# Patient Record
Sex: Female | Born: 1985 | Race: White | Hispanic: No | Marital: Single | Smoking: Current every day smoker
Health system: Southern US, Community
[De-identification: ages and names within clinical notes are randomized; demographics above are authoritative.]

## PROBLEM LIST (undated history)

## (undated) ENCOUNTER — Inpatient Hospital Stay (HOSPITAL_COMMUNITY): Payer: Self-pay

## (undated) DIAGNOSIS — N946 Dysmenorrhea, unspecified: Secondary | ICD-10-CM

## (undated) DIAGNOSIS — N949 Unspecified condition associated with female genital organs and menstrual cycle: Secondary | ICD-10-CM

## (undated) DIAGNOSIS — J309 Allergic rhinitis, unspecified: Secondary | ICD-10-CM

## (undated) DIAGNOSIS — F17200 Nicotine dependence, unspecified, uncomplicated: Secondary | ICD-10-CM

## (undated) DIAGNOSIS — H1045 Other chronic allergic conjunctivitis: Secondary | ICD-10-CM

## (undated) HISTORY — DX: Nicotine dependence, unspecified, uncomplicated: F17.200

## (undated) HISTORY — DX: Dysmenorrhea, unspecified: N94.6

## (undated) HISTORY — PX: COLPOSCOPY: SHX161

## (undated) HISTORY — DX: Allergic rhinitis, unspecified: J30.9

## (undated) HISTORY — DX: Other chronic allergic conjunctivitis: H10.45

## (undated) HISTORY — DX: Unspecified condition associated with female genital organs and menstrual cycle: N94.9

## (undated) HISTORY — PX: APPENDECTOMY: SHX54

---
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
## 2004-01-08 ENCOUNTER — Other Ambulatory Visit: Admission: RE | Admit: 2004-01-08 | Discharge: 2004-01-08 | Payer: Self-pay | Admitting: Family Medicine

## 2004-01-08 ENCOUNTER — Ambulatory Visit: Payer: Self-pay | Admitting: Family Medicine

## 2004-02-10 ENCOUNTER — Ambulatory Visit: Payer: Self-pay | Admitting: Family Medicine

## 2004-02-17 ENCOUNTER — Ambulatory Visit (HOSPITAL_COMMUNITY): Admission: RE | Admit: 2004-02-17 | Discharge: 2004-02-17 | Payer: Self-pay | Admitting: Family Medicine

## 2004-02-27 ENCOUNTER — Emergency Department (HOSPITAL_COMMUNITY): Admission: EM | Admit: 2004-02-27 | Discharge: 2004-02-27 | Payer: Self-pay | Admitting: Emergency Medicine

## 2004-03-11 ENCOUNTER — Ambulatory Visit: Payer: Self-pay | Admitting: Sports Medicine

## 2004-03-25 ENCOUNTER — Ambulatory Visit (HOSPITAL_COMMUNITY): Admission: RE | Admit: 2004-03-25 | Discharge: 2004-03-25 | Payer: Self-pay | Admitting: Family Medicine

## 2004-04-11 ENCOUNTER — Ambulatory Visit: Payer: Self-pay | Admitting: Family Medicine

## 2004-04-27 ENCOUNTER — Ambulatory Visit: Payer: Self-pay | Admitting: Sports Medicine

## 2004-04-30 ENCOUNTER — Emergency Department (HOSPITAL_COMMUNITY): Admission: EM | Admit: 2004-04-30 | Discharge: 2004-04-30 | Payer: Self-pay | Admitting: Emergency Medicine

## 2004-05-13 ENCOUNTER — Ambulatory Visit: Payer: Self-pay | Admitting: Sports Medicine

## 2004-05-25 ENCOUNTER — Ambulatory Visit: Payer: Self-pay | Admitting: Family Medicine

## 2004-06-08 ENCOUNTER — Ambulatory Visit: Payer: Self-pay | Admitting: Family Medicine

## 2004-06-14 ENCOUNTER — Ambulatory Visit: Payer: Self-pay | Admitting: Family Medicine

## 2004-06-22 ENCOUNTER — Ambulatory Visit: Payer: Self-pay | Admitting: Family Medicine

## 2004-06-28 ENCOUNTER — Ambulatory Visit: Payer: Self-pay | Admitting: Sports Medicine

## 2004-07-04 ENCOUNTER — Ambulatory Visit: Payer: Self-pay | Admitting: Sports Medicine

## 2004-07-15 ENCOUNTER — Ambulatory Visit: Payer: Self-pay | Admitting: Family Medicine

## 2004-07-18 ENCOUNTER — Inpatient Hospital Stay (HOSPITAL_COMMUNITY): Admission: AD | Admit: 2004-07-18 | Discharge: 2004-07-20 | Payer: Self-pay | Admitting: Family Medicine

## 2004-07-18 ENCOUNTER — Ambulatory Visit: Payer: Self-pay | Admitting: Obstetrics and Gynecology

## 2004-08-30 ENCOUNTER — Ambulatory Visit: Payer: Self-pay | Admitting: Family Medicine

## 2004-08-30 ENCOUNTER — Encounter (INDEPENDENT_AMBULATORY_CARE_PROVIDER_SITE_OTHER): Payer: Self-pay | Admitting: *Deleted

## 2004-08-30 ENCOUNTER — Other Ambulatory Visit: Admission: RE | Admit: 2004-08-30 | Discharge: 2004-08-30 | Payer: Self-pay | Admitting: Family Medicine

## 2004-09-13 ENCOUNTER — Ambulatory Visit: Payer: Self-pay | Admitting: Family Medicine

## 2004-09-22 ENCOUNTER — Ambulatory Visit: Payer: Self-pay | Admitting: Family Medicine

## 2005-03-01 ENCOUNTER — Encounter (INDEPENDENT_AMBULATORY_CARE_PROVIDER_SITE_OTHER): Payer: Self-pay | Admitting: *Deleted

## 2005-03-01 LAB — CONVERTED CEMR LAB

## 2005-03-09 ENCOUNTER — Ambulatory Visit: Payer: Self-pay | Admitting: Family Medicine

## 2005-03-29 ENCOUNTER — Ambulatory Visit: Payer: Self-pay | Admitting: Sports Medicine

## 2005-03-29 ENCOUNTER — Other Ambulatory Visit: Admission: RE | Admit: 2005-03-29 | Discharge: 2005-03-29 | Payer: Self-pay | Admitting: Family Medicine

## 2005-11-15 ENCOUNTER — Ambulatory Visit: Payer: Self-pay | Admitting: Sports Medicine

## 2005-11-22 ENCOUNTER — Encounter (INDEPENDENT_AMBULATORY_CARE_PROVIDER_SITE_OTHER): Payer: Self-pay | Admitting: Specialist

## 2005-11-22 ENCOUNTER — Ambulatory Visit: Payer: Self-pay | Admitting: Family Medicine

## 2005-11-23 ENCOUNTER — Ambulatory Visit (HOSPITAL_COMMUNITY): Admission: RE | Admit: 2005-11-23 | Discharge: 2005-11-23 | Payer: Self-pay | Admitting: Family Medicine

## 2006-01-02 ENCOUNTER — Ambulatory Visit: Payer: Self-pay | Admitting: Sports Medicine

## 2006-01-30 ENCOUNTER — Ambulatory Visit: Payer: Self-pay | Admitting: Family Medicine

## 2006-02-08 ENCOUNTER — Ambulatory Visit (HOSPITAL_COMMUNITY): Admission: RE | Admit: 2006-02-08 | Discharge: 2006-02-08 | Payer: Self-pay | Admitting: Family Medicine

## 2006-03-02 ENCOUNTER — Ambulatory Visit: Payer: Self-pay | Admitting: Family Medicine

## 2006-04-02 ENCOUNTER — Ambulatory Visit: Payer: Self-pay | Admitting: Sports Medicine

## 2006-05-04 ENCOUNTER — Ambulatory Visit: Payer: Self-pay | Admitting: Family Medicine

## 2006-05-16 ENCOUNTER — Ambulatory Visit: Payer: Self-pay | Admitting: Family Medicine

## 2006-05-31 ENCOUNTER — Ambulatory Visit: Payer: Self-pay | Admitting: Family Medicine

## 2006-06-07 ENCOUNTER — Encounter (INDEPENDENT_AMBULATORY_CARE_PROVIDER_SITE_OTHER): Payer: Self-pay | Admitting: Family Medicine

## 2006-06-07 ENCOUNTER — Ambulatory Visit: Payer: Self-pay | Admitting: Family Medicine

## 2006-06-15 ENCOUNTER — Ambulatory Visit: Payer: Self-pay | Admitting: Family Medicine

## 2006-06-26 ENCOUNTER — Ambulatory Visit: Payer: Self-pay | Admitting: Family Medicine

## 2006-06-28 DIAGNOSIS — J309 Allergic rhinitis, unspecified: Secondary | ICD-10-CM

## 2006-06-28 DIAGNOSIS — N879 Dysplasia of cervix uteri, unspecified: Secondary | ICD-10-CM | POA: Insufficient documentation

## 2006-06-28 DIAGNOSIS — H1045 Other chronic allergic conjunctivitis: Secondary | ICD-10-CM

## 2006-06-28 DIAGNOSIS — N946 Dysmenorrhea, unspecified: Secondary | ICD-10-CM

## 2006-06-28 HISTORY — DX: Dysmenorrhea, unspecified: N94.6

## 2006-06-28 HISTORY — DX: Allergic rhinitis, unspecified: J30.9

## 2006-06-28 HISTORY — DX: Other chronic allergic conjunctivitis: H10.45

## 2006-06-29 ENCOUNTER — Encounter (INDEPENDENT_AMBULATORY_CARE_PROVIDER_SITE_OTHER): Payer: Self-pay | Admitting: *Deleted

## 2006-06-30 ENCOUNTER — Ambulatory Visit: Payer: Self-pay | Admitting: *Deleted

## 2006-06-30 ENCOUNTER — Inpatient Hospital Stay (HOSPITAL_COMMUNITY): Admission: AD | Admit: 2006-06-30 | Discharge: 2006-07-03 | Payer: Self-pay | Admitting: Obstetrics & Gynecology

## 2006-08-10 ENCOUNTER — Encounter (INDEPENDENT_AMBULATORY_CARE_PROVIDER_SITE_OTHER): Payer: Self-pay | Admitting: Family Medicine

## 2007-05-16 ENCOUNTER — Other Ambulatory Visit: Admission: RE | Admit: 2007-05-16 | Discharge: 2007-05-16 | Payer: Self-pay | Admitting: Family Medicine

## 2007-05-17 ENCOUNTER — Encounter (INDEPENDENT_AMBULATORY_CARE_PROVIDER_SITE_OTHER): Payer: Self-pay | Admitting: Family Medicine

## 2007-05-17 ENCOUNTER — Encounter: Payer: Self-pay | Admitting: Family Medicine

## 2007-05-17 ENCOUNTER — Ambulatory Visit: Payer: Self-pay | Admitting: Family Medicine

## 2007-05-17 DIAGNOSIS — N949 Unspecified condition associated with female genital organs and menstrual cycle: Secondary | ICD-10-CM

## 2007-05-17 DIAGNOSIS — N925 Other specified irregular menstruation: Secondary | ICD-10-CM

## 2007-05-17 DIAGNOSIS — N938 Other specified abnormal uterine and vaginal bleeding: Secondary | ICD-10-CM

## 2007-05-17 HISTORY — DX: Other specified irregular menstruation: N92.5

## 2007-05-17 HISTORY — DX: Other specified abnormal uterine and vaginal bleeding: N93.8

## 2007-05-17 LAB — CONVERTED CEMR LAB
Beta hcg, urine, semiquantitative: NEGATIVE
HCT: 43.1 % (ref 36.0–46.0)
Hemoglobin: 13.7 g/dL (ref 12.0–15.0)
MCHC: 31.8 g/dL (ref 30.0–36.0)
MCV: 85.7 fL (ref 78.0–100.0)
Platelets: 179 10*3/uL (ref 150–400)
RBC: 5.03 M/uL (ref 3.87–5.11)
RDW: 14.3 % (ref 11.5–15.5)
WBC: 4.7 10*3/uL (ref 4.0–10.5)

## 2007-05-21 LAB — CONVERTED CEMR LAB: Pap Smear: NORMAL

## 2007-08-12 ENCOUNTER — Emergency Department (HOSPITAL_COMMUNITY): Admission: EM | Admit: 2007-08-12 | Discharge: 2007-08-13 | Payer: Self-pay | Admitting: Emergency Medicine

## 2008-06-17 ENCOUNTER — Encounter: Payer: Self-pay | Admitting: Family Medicine

## 2008-06-17 ENCOUNTER — Ambulatory Visit: Payer: Self-pay | Admitting: Family Medicine

## 2008-06-17 DIAGNOSIS — N898 Other specified noninflammatory disorders of vagina: Secondary | ICD-10-CM | POA: Insufficient documentation

## 2008-06-17 LAB — CONVERTED CEMR LAB
Chlamydia, DNA Probe: NEGATIVE
GC Probe Amp, Genital: NEGATIVE

## 2008-06-24 ENCOUNTER — Encounter: Payer: Self-pay | Admitting: Family Medicine

## 2008-06-25 ENCOUNTER — Telehealth: Payer: Self-pay | Admitting: Family Medicine

## 2009-05-03 ENCOUNTER — Encounter (INDEPENDENT_AMBULATORY_CARE_PROVIDER_SITE_OTHER): Payer: Self-pay | Admitting: *Deleted

## 2009-05-03 DIAGNOSIS — F17200 Nicotine dependence, unspecified, uncomplicated: Secondary | ICD-10-CM

## 2009-05-03 HISTORY — DX: Nicotine dependence, unspecified, uncomplicated: F17.200

## 2010-03-13 ENCOUNTER — Emergency Department (HOSPITAL_COMMUNITY): Admission: EM | Admit: 2010-03-13 | Discharge: 2010-03-13 | Payer: Self-pay | Admitting: Emergency Medicine

## 2010-05-31 NOTE — Miscellaneous (Signed)
Summary: [name]  Clinical Lists Changes  Problems: Added new problem of [name] (ICD-305.1) 

## 2010-06-16 ENCOUNTER — Emergency Department (HOSPITAL_COMMUNITY)
Admission: EM | Admit: 2010-06-16 | Discharge: 2010-06-17 | Disposition: A | Discharge status: Still In Hospital | Payer: Managed Care, Other (non HMO) | Attending: Emergency Medicine | Admitting: Emergency Medicine

## 2010-06-16 DIAGNOSIS — N898 Other specified noninflammatory disorders of vagina: Secondary | ICD-10-CM | POA: Insufficient documentation

## 2010-06-16 LAB — DIFFERENTIAL
Basophils Absolute: 0 10*3/uL (ref 0.0–0.1)
Basophils Relative: 0 % (ref 0–1)
Eosinophils Absolute: 0.3 10*3/uL (ref 0.0–0.7)
Eosinophils Relative: 4 % (ref 0–5)
Lymphocytes Relative: 25 % (ref 12–46)
Lymphs Abs: 1.9 10*3/uL (ref 0.7–4.0)
Monocytes Absolute: 0.5 10*3/uL (ref 0.1–1.0)
Monocytes Relative: 7 % (ref 3–12)
Neutro Abs: 4.9 10*3/uL (ref 1.7–7.7)
Neutrophils Relative %: 64 % (ref 43–77)

## 2010-06-16 LAB — POCT I-STAT, CHEM 8
BUN: 5 mg/dL — ABNORMAL LOW (ref 6–23)
Calcium, Ion: 1.2 mmol/L (ref 1.12–1.32)
Chloride: 105 mEq/L (ref 96–112)
Creatinine, Ser: 0.6 mg/dL (ref 0.4–1.2)
Glucose, Bld: 87 mg/dL (ref 70–99)
HCT: 32 % — ABNORMAL LOW (ref 36.0–46.0)
Hemoglobin: 10.9 g/dL — ABNORMAL LOW (ref 12.0–15.0)
Potassium: 3.3 mEq/L — ABNORMAL LOW (ref 3.5–5.1)
Sodium: 140 mEq/L (ref 135–145)
TCO2: 22 mmol/L (ref 0–100)

## 2010-06-16 LAB — CBC
HCT: 32.3 % — ABNORMAL LOW (ref 36.0–46.0)
Hemoglobin: 10.5 g/dL — ABNORMAL LOW (ref 12.0–15.0)
MCH: 27.6 pg (ref 26.0–34.0)
MCHC: 32.5 g/dL (ref 30.0–36.0)
MCV: 85 fL (ref 78.0–100.0)
Platelets: 175 10*3/uL (ref 150–400)
RBC: 3.8 MIL/uL — ABNORMAL LOW (ref 3.87–5.11)
RDW: 14.3 % (ref 11.5–15.5)
WBC: 7.6 10*3/uL (ref 4.0–10.5)

## 2010-06-17 ENCOUNTER — Inpatient Hospital Stay (HOSPITAL_COMMUNITY): Payer: Managed Care, Other (non HMO)

## 2010-06-17 ENCOUNTER — Inpatient Hospital Stay (HOSPITAL_COMMUNITY)
Admission: AD | Admit: 2010-06-17 | Discharge: 2010-06-17 | Disposition: A | Discharge status: Home / Self Care | Payer: Managed Care, Other (non HMO) | Source: Other Acute Inpatient Hospital | Attending: Obstetrics and Gynecology | Admitting: Obstetrics and Gynecology

## 2010-06-17 DIAGNOSIS — N949 Unspecified condition associated with female genital organs and menstrual cycle: Secondary | ICD-10-CM | POA: Insufficient documentation

## 2010-06-17 DIAGNOSIS — N938 Other specified abnormal uterine and vaginal bleeding: Secondary | ICD-10-CM | POA: Insufficient documentation

## 2010-06-17 LAB — HCG, QUANTITATIVE, PREGNANCY: hCG, Beta Chain, Quant, S: 2737 m[IU]/mL — ABNORMAL HIGH (ref <5)

## 2010-07-12 LAB — RAPID STREP SCREEN (MED CTR MEBANE ONLY): Streptococcus, Group A Screen (Direct): POSITIVE — AB

## 2010-07-14 ENCOUNTER — Encounter: Payer: Managed Care, Other (non HMO) | Admitting: Family Medicine

## 2010-08-09 ENCOUNTER — Other Ambulatory Visit (HOSPITAL_COMMUNITY)
Admission: RE | Admit: 2010-08-09 | Discharge: 2010-08-09 | Disposition: A | Discharge status: Home / Self Care | Payer: Managed Care, Other (non HMO) | Source: Ambulatory Visit | Attending: Family Medicine | Admitting: Family Medicine

## 2010-08-09 ENCOUNTER — Other Ambulatory Visit (HOSPITAL_COMMUNITY): Admission: RE | Admit: 2010-08-09 | Payer: Managed Care, Other (non HMO) | Source: Ambulatory Visit

## 2010-08-09 ENCOUNTER — Ambulatory Visit (INDEPENDENT_AMBULATORY_CARE_PROVIDER_SITE_OTHER): Payer: Managed Care, Other (non HMO) | Admitting: Family Medicine

## 2010-08-09 ENCOUNTER — Encounter: Payer: Self-pay | Admitting: Family Medicine

## 2010-08-09 DIAGNOSIS — Z124 Encounter for screening for malignant neoplasm of cervix: Secondary | ICD-10-CM

## 2010-08-09 DIAGNOSIS — N898 Other specified noninflammatory disorders of vagina: Secondary | ICD-10-CM

## 2010-08-09 DIAGNOSIS — Z01419 Encounter for gynecological examination (general) (routine) without abnormal findings: Secondary | ICD-10-CM | POA: Insufficient documentation

## 2010-08-09 LAB — POCT WET PREP (WET MOUNT)
Clue Cells Wet Prep HPF POC: NEGATIVE
Trichomonas Wet Prep HPF POC: NEGATIVE
Yeast Wet Prep HPF POC: NEGATIVE

## 2010-08-09 MED ORDER — ONDANSETRON 4 MG PO TBDP: 4.0000 mg | ORAL_TABLET | Freq: Three times a day (TID) | ORAL | Status: AC | PRN

## 2010-08-09 MED ORDER — NORGESTIMATE-ETH ESTRADIOL 0.25-35 MG-MCG PO TABS: 1.0000 | ORAL_TABLET | Freq: Every day | ORAL | Status: DC

## 2010-08-09 NOTE — Progress Notes (Signed)
  Subjective:    Patient ID: Melissa Shelton, female    DOB: [birth year], 25 y.o.   MRN: [id number]  HPI 1. Complete Physical Exam with PAP smear Normal appearing cervix, normal vaginal/bimanual exam. Overall normal physical. Has psoriasis of the lower extremities and back.  2. Psoriasis Currently not treating. Covers lower legs and back. Patient says this is improving.  3. Swelling of ankles and hands She has swelling of her ankles and fingers, which is not apparent on exam today. Psoriasis can be associated with swelling and arthritis and her psoriasis is diffuse. 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  4. Nausea and abdominal pain with eating Pain after eating associated with nausea and recent stress (child/moving jobs/losing baby-sitter) 2 week history.  5. Problems with childs behavior at home Advised to bring child in for evaluation.     Review of Systems  All other systems reviewed and are negative.       Objective:   Physical Exam  Vitals reviewed. Constitutional: She appears well-developed and well-nourished.  HENT:  Head: Normocephalic and atraumatic.  Eyes: Pupils are equal, round, and reactive to light.  Neck: No thyromegaly present.  Cardiovascular: Normal rate, regular rhythm and normal heart sounds.   No murmur heard. Pulmonary/Chest: Breath sounds normal. No respiratory distress. She has no wheezes. She has no rales.  Abdominal: She exhibits no distension and no mass. There is no tenderness.  Genitourinary: Vagina normal. No vaginal discharge found.  Lymphadenopathy:    She has no cervical adenopathy.  Neurological: She is alert.  Skin: Skin is warm. Rash noted.  Psychiatric: She has a normal mood and affect.        Assessment & Plan:  1. Complete Physical Exam with PAP smear Will relay results of PAP to patient. Physical in one year.  2. Psoriasis Improving, patient does not want treatment at  this time  3. Swelling of ankles and hands Advised compression stockings for legs. The hands could be related to psoriasis, she says running cold water on them helps.  4. Nausea and abdominal pain with eating Sounds like gastritis from stress vs ulcer. With two week only history shell call me for nexium if not improved in a week or so.  Zofran 4mg  ODT  5. Problems with childs behavior at home Advised to bring child in for evaluation.

## 2010-08-10 LAB — GC/CHLAMYDIA PROBE AMP, GENITAL
Chlamydia, DNA Probe: NEGATIVE
GC Probe Amp, Genital: NEGATIVE

## 2010-09-16 NOTE — Op Note (Signed)
NAME:  Melissa Shelton, Melissa Shelton                           ACCOUNT NO.:  [id number]   MEDICAL RECORD NO.:  [id number]                   PATIENT TYPE:  INP   LOCATION:  0102                                 FACILITY:  [facility]   PHYSICIAN:  Anselm Pancoast. Zachery Dakins, M.D.          DATE OF BIRTH:  20-Jan-1986   DATE OF PROCEDURE:  07/24/2003  DATE OF DISCHARGE:                                 OPERATIVE REPORT   PREOPERATIVE DIAGNOSIS:  Acute appendicitis, probably ruptured, retrocecal  right.   OPERATION:  Open appendectomy.   POSTOPERATIVE DIAGNOSIS:  Retrocecal ruptured appendicitis.   ANESTHESIA:  General.   SURGEON:  Dr. Consuello Bossier.   ASSISTANT:  Nurse.   HISTORY:  [name] is an 26 year old female, who presented back to the  emergency room today after being seen here yesterday with a several day  history of right lower quadrant abdominal pain.  She had an elevated white  count.  No definite diagnosis was made, and she was instructed that if she  got worse to return.  She did and on repeat exam not, she is more tender in  the right lower quadrant, and her white count was 20,000 previously; it is  now 21,900.  She did have a CT which __________  appendix due to the pelvis  retrocecal position.  The patient, on exam, she is definitely tender in her  abdomen, right and left, right more than left, and I think an open  appendectomy is the most appropriate management of this.  The patient was  given 3 g of Unasyn, taken to the operative suite.   Induction of general anesthesia.  I did place a Foley catheter.  The abdomen  was prepped with Betadine solution and draped in a sterile manner.  A small  right lower quadrant incision was made, and was taken down through skin,  subcutaneous, Scarpa's fascia, external oblique, internal oblique and the  rectus muscle.  Peritoneum and transversalis carefully opened inside the  abdominal cavity.  There was turbid in the appendix and the cecum was  somewhat gaseous, and I could visualize the appendix, but it was kind of  stuck down retrocecal.  I packed off the small bowel medially so that I  could free the appendiceal mesentery, and this was divided between [facility]  and then ligated with 2-0 Vicryl.  The appendix had ruptured pretty close to  its base.  __________  quadrant, localized with the area behind the cecum,  and I freed up the area, carefully irrigated after cultures had been taken.  Then could free it up to the base __________ .  This was clamped with 2-0  Vicryl, the ruptured appendix removed, stump inverted __________  pelvis  __________  area, and then the peritoneum and transversalis was closed with  running 2-0 Vicryl and the internal oblique and the external oblique was  closed with 2-0 Vicryl.  Scarpa's was closed with  4-0 Vicryl subcuticular  stitches, Benzoin and Steri-Strips and then we irrigated all layers  carefully, and we will keep her on broad antibiotic coverage, and hopefully  she will improve quickly.  Sponge and needle counts were correct __________  .                                               Anselm Pancoast. Zachery Dakins, M.D.   WJW/MEDQ  D:  07/24/2003  T:  07/24/2003  Job:  [id number]

## 2010-09-16 NOTE — H&P (Signed)
NAME:  Melissa, Shelton                           ACCOUNT NO.:  [id number]   MEDICAL RECORD NO.:  [id number]                   PATIENT TYPE:  INP   LOCATION:  0455                                 FACILITY:  [facility]   PHYSICIAN:  Anselm Pancoast. Zachery Dakins, M.D.          DATE OF BIRTH:  10-May-1985   DATE OF ADMISSION:  07/23/2003  DATE OF DISCHARGE:                                HISTORY & PHYSICAL   CHIEF COMPLAINT:  Abdominal pain.   HISTORY:  Melissa Shelton is an 25 year old female who has had abdominal pain of  several day's duration.  The patient states pain was in her lower abdomen.  She had some diarrhea.  In fact, she was seen in the emergency room  yesterday when she was treated with Dilaudid.  White count was markedly  elevated.  Diagnosis was not made.  She was released with instructions that  if she got worse to return.  She did get worse and returned with definite  lower abdominal tenderness of both right and left.  She noted that if she  moves her leg if definitely decreases the pain in the right lower quadrant.  White count at this time was 21,900 with a left shift, it was 20,000  yesterday.  Urinalysis showed a concentrated urine, no evidence of definite  pyelonephritis, and urine was positive for blood on cathed specimen.  The  patient was seen at this time by Dr. ______________when she came in was  treated with pain medication and IV started.  CT was obtained, and I was  called at approximately 2 a.m.  CT was also _____________.  The patient was  significantly tender in her lower abdomen.  After having received Dilaudid  was much better.   PAST MEDICAL HISTORY:  Dental extractions.   ALLERGIES:  No known drug allergies.   MEDICATIONS:  Birth control patch.   PAST SURGICAL HISTORY:  No previous surgeries.   PHYSICAL EXAMINATION:  GENERAL:  She is a pale Caucasian female in moderate  distress.  VITAL SIGNS:  Temperature was 101.5, blood pressure was 112/43, pulse of  103.   After a liter or so of IV fluids her respirations were 18.  HEENT:  Normocephalic.  Pupils equal, round, reactive.  Hydrated.  NECK:  No cervical adenopathy.  LUNGS:  Good breath sounds.  BREASTS:  Negative.  CARDIAC:  Sinus tachycardia.  ABDOMEN:  Tender in the lower abdomen, right more so than left, and the  upper abdomen is not anywhere near as tender.  PELVIC:  I did not do a pelvic exam, I think the ER physician had.  EXTREMITIES:  No pedal edema and ______________.   IMPRESSION:  Acute appendicitis, probable rupture.   PLAN:  The patient was given one dose of Cipro and IV Unasyn, 3 g of Unasyn.  The patient will take __________.  CT shows the appendix is  _____________down in the pelvis and ____________.  Anselm Pancoast. Zachery Dakins, M.D.    WJW/MEDQ  D:  07/24/2003  T:  07/24/2003  Job:  [id number]

## 2010-09-16 NOTE — Discharge Summary (Signed)
NAME:  [name], [name]                           ACCOUNT NO.:  [id number]   MEDICAL RECORD NO.:  [id number]                   PATIENT TYPE:  INP   LOCATION:  0455                                 FACILITY:  [facility]   PHYSICIAN:  Anselm Pancoast. Zachery Dakins, M.D.          DATE OF BIRTH:  1986-02-01   DATE OF ADMISSION:  07/23/2003  DATE OF DISCHARGE:  07/27/2003                                 DISCHARGE SUMMARY   DISCHARGING DIAGNOSES:  1. Ruptured appendicitis, retrocecal.  2. Urinary tract infection.   OPERATION:  Open appendectomy.   HISTORY:  Melissa Shelton is an 25 year old female who was admitted through the  emergency room after she had been seen the previous evening with lower  abdominal pain treated with pain medication and indefinite diagnosis made,  her white count was 20,000 and was instructed if she did not get better to  return, she did not and she returned the following evening and was  definitely tender in the lower abdomen right and left.  A repeat white count  was now 21,900 and urine culture was obtained and a CAT scan ordered.  The  CAT scan showed a retrocecal appendix and on examination she was definitely  tender in the lower abdomen and I recommended we proceed on with an open  appendectomy.  She was taken to surgery approximately 4 a.m. and had a  markedly inflamed retrocecal appendix that had a localized peritonitis  around it and I had started her on Unasyn preoperatively.  Patient  postoperatively was definitely better and then the following day her  temperature was decreasing.  Her white count was still 20,000 and Dr. Gerrit Friends  was on call and she was encouraged to be up and ambulating.  She has done  that and her pain is definitely less, her temperature has subsided and on  examination she is no longer tender in the lower abdomen and her incision  appears to be healing nicely.  The urine culture that was obtained on Friday  evening has returned today and it is actually  growing greater than 100,000  of Escherichia coli that is not sensitive to the ampicillin and I expect  that this is possibly the reason that she is a little sluggish in recovering  quickly.  The OR cultures grew a few gram-negative rods and gram-positive  were seen on a Gram stain but I cannot see definite results of the aerobic  culture.  I think it would be best to switch her to Keflex p.o. instead of  Augmentin and continue this for 5 days and see her back in the office in  approximately 4 or 5 days.  The patient works as a Conservation officer, nature and hopes to be  able to return to work later this week and I gave her an excuse to excuse  her from work this past week because of her illness.  Anselm Pancoast. Zachery Dakins, M.D.    WJW/MEDQ  D:  07/27/2003  T:  07/27/2003  Job:  [id number]

## 2010-09-16 NOTE — Discharge Summary (Signed)
Melissa Shelton, Melissa Shelton                 ACCOUNT NO.:  [id number]   MEDICAL RECORD NO.:  [id number]          PATIENT TYPE:  INP   LOCATION:  9104                          FACILITY:  [facility]   PHYSICIAN:  Tanya S. Shawnie Pons, M.D.   DATE OF BIRTH:  Jul 25, 1985   DATE OF ADMISSION:  07/18/2004  DATE OF DISCHARGE:                                 DISCHARGE SUMMARY   DISCHARGE DIAGNOSES:  1.  Postpartum day #2 status post delivery of a term female via normal      spontaneous vaginal delivery.  2.  Teen pregnancy.  3.  Breast feeding.  4.  Rubella immune.   DISCHARGE MEDICATIONS:  1.  Prenatal vitamins one tablet p.o. daily.  2.  Ibuprofen 600 mg one tablet p.o. q.6h. p.r.n. pain and cramping.   HOSPITAL COURSE:  Melissa Shelton is a 25 year old now G1 P1-0-0-1 who presented at 40  weeks 6 days intrauterine pregnancy with painful contractions. At the time  of admission her cervix was dilated to 3 cm, she was 80% effaced, and -1  station. She ambulated and received Stadol for pain. She was placed on  external fetal monitoring with reactive strip, accelerations, and a fetal  heart rate baseline of 125-135 with mild variable decelerations. For pain  control she did decide to get an epidural after she had already received two  doses of Stadol. At 0850 on July 18, 2004 she had AROM with clear fluid. We  confirmed her GBS status, which was negative. Labor progressed without any  augmentation. At 1107 she delivered a viable female by normal spontaneous  vaginal delivery over intact perineum with Apgars of 9 at one and 9 at five  under epidural anesthesia. She was bulb-suctioned at the perineum. Cord was  clamped and cut. Intact three-vessel cord. Placenta was delivered  spontaneously at 1124. She had small left periurethral hemostatic  lacerations that did not require suturing. Her postpartum course was  uncomplicated other than some difficulties with breastfeeding which were  required lactation consultants, and a  mild anemia with a hemoglobin that  went from 12.8 to 9.2. She is still unsure about contraception but does  think she either wants Ortho Evra or the NuvaRing at her 6-week postpartum  visit. Her baby did have some mild jaundice with a skin bili check at 46  hours of 9.9 which is mild, and she was given appropriate instructions for  discharge planning. We will see she and the baby back this Friday on July 22, 2004.      KB/MEDQ  D:  07/20/2004  T:  07/20/2004  Job:  [id number]   cc:   Seymour Bars, D.O.  [facility].  Family Prac. Resident  [place], [place] [address]  Fax: [phone]

## 2011-01-24 LAB — RAPID STREP SCREEN (MED CTR MEBANE ONLY): Streptococcus, Group A Screen (Direct): NEGATIVE

## 2011-01-24 LAB — INFLUENZA A+B VIRUS AG-DIRECT(RAPID)
Inflenza A Ag: POSITIVE — AB
Influenza B Ag: NEGATIVE

## 2011-08-11 ENCOUNTER — Telehealth: Payer: Self-pay | Admitting: Family Medicine

## 2011-08-11 ENCOUNTER — Encounter: Payer: Self-pay | Admitting: Family Medicine

## 2011-08-11 ENCOUNTER — Other Ambulatory Visit (HOSPITAL_COMMUNITY)
Admission: RE | Admit: 2011-08-11 | Discharge: 2011-08-11 | Disposition: A | Discharge status: Home / Self Care | Payer: Managed Care, Other (non HMO) | Source: Ambulatory Visit | Attending: Family Medicine | Admitting: Family Medicine

## 2011-08-11 ENCOUNTER — Ambulatory Visit (INDEPENDENT_AMBULATORY_CARE_PROVIDER_SITE_OTHER): Payer: Managed Care, Other (non HMO) | Admitting: Family Medicine

## 2011-08-11 VITALS — BP 96/68 | HR 64 | Temp 98.1°F | Ht 65.6 in | Wt 135.0 lb

## 2011-08-11 DIAGNOSIS — F17200 Nicotine dependence, unspecified, uncomplicated: Secondary | ICD-10-CM

## 2011-08-11 DIAGNOSIS — Z113 Encounter for screening for infections with a predominantly sexual mode of transmission: Secondary | ICD-10-CM | POA: Insufficient documentation

## 2011-08-11 DIAGNOSIS — Z01419 Encounter for gynecological examination (general) (routine) without abnormal findings: Secondary | ICD-10-CM | POA: Insufficient documentation

## 2011-08-11 DIAGNOSIS — N760 Acute vaginitis: Secondary | ICD-10-CM

## 2011-08-11 DIAGNOSIS — L408 Other psoriasis: Secondary | ICD-10-CM

## 2011-08-11 DIAGNOSIS — Z124 Encounter for screening for malignant neoplasm of cervix: Secondary | ICD-10-CM

## 2011-08-11 DIAGNOSIS — L400 Psoriasis vulgaris: Secondary | ICD-10-CM | POA: Insufficient documentation

## 2011-08-11 DIAGNOSIS — D235 Other benign neoplasm of skin of trunk: Secondary | ICD-10-CM

## 2011-08-11 DIAGNOSIS — D225 Melanocytic nevi of trunk: Secondary | ICD-10-CM | POA: Insufficient documentation

## 2011-08-11 DIAGNOSIS — B9689 Other specified bacterial agents as the cause of diseases classified elsewhere: Secondary | ICD-10-CM

## 2011-08-11 DIAGNOSIS — Z23 Encounter for immunization: Secondary | ICD-10-CM

## 2011-08-11 DIAGNOSIS — A499 Bacterial infection, unspecified: Secondary | ICD-10-CM

## 2011-08-11 DIAGNOSIS — N879 Dysplasia of cervix uteri, unspecified: Secondary | ICD-10-CM

## 2011-08-11 LAB — POCT WET PREP (WET MOUNT)

## 2011-08-11 MED ORDER — METRONIDAZOLE 500 MG PO TABS: 500.0000 mg | ORAL_TABLET | Freq: Three times a day (TID) | ORAL | Status: AC

## 2011-08-11 NOTE — Telephone Encounter (Signed)
What would you like me to tell her?

## 2011-08-11 NOTE — Patient Instructions (Signed)
It was great to see you today!  Schedule an appointment to see me in one year.  I will call you with your PAP results.  I have referred you to dermatology for your psoriasis.

## 2011-08-11 NOTE — Assessment & Plan Note (Signed)
PAP smear done today. 08/11/2011

## 2011-08-11 NOTE — Progress Notes (Signed)
Addended by: Edd Arbour on: 08/11/2011 12:35 PM   Modules accepted: Orders

## 2011-08-11 NOTE — Progress Notes (Signed)
Procedure Note: Cryoablation of left breast nevus  Informed Consent Obtained for above. All Risks, benefits, alternative, complications reviewed with patient and understood. Time Out performed. Using no local anesthesia. Using a liquid nitrogen gun the nevus was frozen multiple times The patient tolerated the procedure well. No complications occurred. Patient counseled on post-operative care.

## 2011-08-11 NOTE — Assessment & Plan Note (Signed)
Advised patient to quit. She is not ready to quit at this time.

## 2011-08-11 NOTE — Telephone Encounter (Signed)
Left message with patient concerning her [facility] and flagyl prescription.

## 2011-08-11 NOTE — Assessment & Plan Note (Signed)
Referral to Dermatology, I would like her to start Enbrel, or other med.

## 2011-08-11 NOTE — Progress Notes (Signed)
  Subjective:    Patient ID: Melissa Shelton, female    DOB: Feb 26, 1986, 26 y.o.   MRN: [id number]  HPI 1. Annual physical Doing well. Still smoking.  Daughter doing better with behavior.  She is no longer with her previous boyfriend and things are better. Work is going well.   2. 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 She has diffuse salmon colored plaques all over her body.  No fevers. Not on any medications.   3. Nevus, discomfort Left breast nevus that is rubbing on bra and causing discomfort.   4. PAP Normal appearing GU exam. She has complaints of minimal discharge. No new partners. Has not had sex recently.    Review of Systems Pertinent items are noted in HPI.     Objective:   Physical Exam Filed Vitals:   08/11/11 1149  BP: 96/68  Pulse: 64  Temp: 98.1 F (36.7 C)  TempSrc: Oral  Height: 5' 5.6" (1.666 m)  Weight: 135 lb (61.236 kg)  Lungs:  Normal respiratory effort, chest expands symmetrically. Lungs are clear to auscultation, no crackles or wheezes. Extremities:   Non-tender, No cyanosis, edema, or deformity noted. Heart - Regular rate and rhythm.  No murmurs, gallops or rubs.    Skin:  Diffuse salmon colored plagues.  Abdomen: soft and non-tender without masses, organomegaly or hernias noted.  No guarding or rebound Female genitalia: not done normal external genitalia, vulva, vagina, cervix, uterus and adnexa Neurologic exam : Cn 2-7 intact Strength equal & normal in upper & lower extremities Able to walk on heels and toes.   Balance normal  Romberg normal, finger to nose  Breast: left breast nevus without atypical features.     Assessment & Plan:

## 2011-08-17 ENCOUNTER — Encounter: Payer: Self-pay | Admitting: Family Medicine

## 2012-05-01 NOTE — L&D Delivery Note (Signed)
Delivery Note At 1:47 PM a viable female was delivered via  (Presentation: Right Occiput Anterior).  APGAR: 9,9 ; weight - pending.  Placenta status: Intact, spontaneous.  Cord: 3 vessel with the following complications: None.  Cord pH: N/A  Anesthesia: Epidural  Episiotomy: None Lacerations: None Est. Blood Loss (mL): 300  Mom to postpartum.  Baby to stay with mother.  Everlene Other 02/20/2013, 1:59 PM   I was present for and supervised the delivery of this newborn.  I agree with above documentation.    Rulon Abide, M.D. [facility] Fellow 02/20/2013 3:58 PM

## 2012-07-04 ENCOUNTER — Encounter: Payer: Self-pay | Admitting: Family Medicine

## 2012-07-04 ENCOUNTER — Ambulatory Visit (INDEPENDENT_AMBULATORY_CARE_PROVIDER_SITE_OTHER): Payer: Managed Care, Other (non HMO) | Admitting: Family Medicine

## 2012-07-04 VITALS — BP 122/79 | HR 84 | Ht 65.6 in | Wt 140.0 lb

## 2012-07-04 DIAGNOSIS — N912 Amenorrhea, unspecified: Secondary | ICD-10-CM

## 2012-07-04 DIAGNOSIS — Z3201 Encounter for pregnancy test, result positive: Secondary | ICD-10-CM

## 2012-07-04 LAB — POCT URINE PREGNANCY: Preg Test, Ur: POSITIVE

## 2012-07-04 NOTE — Patient Instructions (Addendum)
Take a multivitamins daily Doxylamine (unisom): ok to take half of a 25 mg tablet We will call you to schedule a new patient appt  ABCs of Pregnancy A Antepartum care is very important. Be sure you see your doctor and get prenatal care as soon as you think you are pregnant. At this time, you will be tested for infection, genetic abnormalities and potential problems with you and the pregnancy. This is the time to discuss diet, exercise, work, medications, labor, pain medication during labor and the possibility of a cesarean delivery. Ask any questions that may concern you. It is important to see your doctor regularly throughout your pregnancy. Avoid exposure to toxic substances and chemicals - such as cleaning solvents, lead and mercury, some insecticides, and paint. Pregnant women should avoid exposure to paint fumes, and fumes that cause you to feel ill, dizzy or faint. When possible, it is a good idea to have a pre-pregnancy consultation with your caregiver to begin some important recommendations your caregiver suggests such as, taking folic acid, exercising, quitting smoking, avoiding alcoholic beverages, etc. B Breastfeeding is the healthiest choice for both you and your baby. It has many nutritional benefits for the baby and health benefits for the mother. It also creates a very tight and loving bond between the baby and mother. Talk to your doctor, your family and friends, and your employer about how you choose to feed your baby and how they can support you in your decision. Not all birth defects can be prevented, but a woman can take actions that may increase her chance of having a healthy baby. Many birth defects happen very early in pregnancy, sometimes before a woman even knows she is pregnant. Birth defects or abnormalities of any child in your or the father's family should be discussed with your caregiver. Get a good support bra as your breast size changes. Wear it especially when you exercise  and when nursing.  C Celebrate the news of your pregnancy with the your spouse/father and family. Childbirth classes are helpful to take for you and the spouse/father because it helps to understand what happens during the pregnancy, labor and delivery. Cesarean delivery should be discussed with your doctor so you are prepared for that possibility. The pros and cons of circumcision if it is a boy, should be discussed with your pediatrician. Cigarette smoking during pregnancy can result in low birth weight babies. It has been associated with infertility, miscarriages, tubal pregnancies, infant death (mortality) and poor health (morbidity) in childhood. Additionally, cigarette smoking may cause long-term learning disabilities. If you smoke, you should try to quit before getting pregnant and not smoke during the pregnancy. Secondary smoke may also harm a mother and her developing baby. It is a good idea to ask people to stop smoking around you during your pregnancy and after the baby is born. Extra calcium is necessary when you are pregnant and is found in your prenatal vitamin, in dairy products, green leafy vegetables and in calcium supplements. D A healthy diet according to your current weight and height, along with vitamins and mineral supplements should be discussed with your caregiver. Domestic abuse or violence should be made known to your doctor right away to get the situation corrected. Drink more water when you exercise to keep hydrated. Discomfort of your back and legs usually develops and progresses from the middle of the second trimester through to delivery of the baby. This is because of the enlarging baby and uterus, which may also affect  your balance. Do not take illegal drugs. Illegal drugs can seriously harm the baby and you. Drink extra fluids (water is best) throughout pregnancy to help your body keep up with the increases in your blood volume. Drink at least 6 to 8 glasses of water, fruit juice,  or milk each day. A good way to know you are drinking enough fluid is when your urine looks almost like clear water or is very light yellow.  E Eat healthy to get the nutrients you and your unborn baby need. Your meals should include the five basic food groups. Exercise (30 minutes of light to moderate exercise a day) is important and encouraged during pregnancy, if there are no medical problems or problems with the pregnancy. Exercise that causes discomfort or dizziness should be stopped and reported to your caregiver. Emotions during pregnancy can change from being ecstatic to depression and should be understood by you, your partner and your family. F Fetal screening with ultrasound, amniocentesis and monitoring during pregnancy and labor is common and sometimes necessary. Take 400 micrograms of folic acid daily both before, when possible, and during the first few months of pregnancy to reduce the risk of birth defects of the brain and spine. All women who could possibly become pregnant should take a vitamin with folic acid, every day. It is also important to eat a healthy diet with fortified foods (enriched grain products, including cereals, rice, breads, and pastas) and foods with natural sources of folate (orange juice, green leafy vegetables, beans, peanuts, broccoli, asparagus, peas, and lentils). The father should be involved with all aspects of the pregnancy including, the prenatal care, childbirth classes, labor, delivery, and postpartum time. Fathers may also have emotional concerns about being a father, financial needs, and raising a family. G Genetic testing should be done appropriately. It is important to know your family and the father's history. If there have been problems with pregnancies or birth defects in your family, report these to your doctor. Also, genetic counselors can talk with you about the information you might need in making decisions about having a family. You can call a major  medical center in your area for help in finding a board-certified genetic counselor. Genetic testing and counseling should be done before pregnancy when possible, especially if there is a history of problems in the mother's or father's family. Certain ethnic backgrounds are more at risk for genetic defects. H Get familiar with the hospital where you will be having your baby. Get to know how long it takes to get there, the labor and delivery area, and the hospital procedures. Be sure your medical insurance is accepted there. Get your home ready for the baby including, clothes, the baby's room (when possible), furniture and car seat. Hand washing is important throughout the day, especially after handling raw meat and poultry, changing the baby's diaper or using the bathroom. This can help prevent the spread of many bacteria and viruses that cause infection. Your hair may become dry and thinner, but will return to normal a few weeks after the baby is born. Heartburn is a common problem that can be treated by taking antacids recommended by your caregiver, eating smaller meals 5 or 6 times a day, not drinking liquids when eating, drinking between meals and raising the head of your bed 2 to 3 inches. I Insurance to cover you, the baby, doctor and hospital should be reviewed so that you will be prepared to pay any costs not covered by your insurance  plan. If you do not have medical insurance, there are usually clinics and services available for you in your community. Take 30 milligrams of iron during your pregnancy as prescribed by your doctor to reduce the risk of low red blood cells (anemia) later in pregnancy. All women of childbearing age should eat a diet rich in iron. J There should be a joint effort for the mother, father and any other children to adapt to the pregnancy financially, emotionally, and psychologically during the pregnancy. Join a support group for moms-to-be. Or, join a class on parenting or  childbirth. Have the family participate when possible. K Know your limits. Let your caregiver know if you experience any of the following:   Pain of any kind.  Strong cramps.  You develop a lot of weight in a short period of time (5 pounds in 3 to 5 days).  Vaginal bleeding, leaking of amniotic fluid.  Headache, vision problems.  Dizziness, fainting, shortness of breath.  Chest pain.  Fever of 102 F (38.9 C) or higher.  Gush of clear fluid from your vagina.  Painful urination.  Domestic violence.  Irregular heartbeat (palpitations).  Rapid beating of the heart (tachycardia).  Constant feeling sick to your stomach (nauseous) and vomiting.  Trouble walking, fluid retention (edema).  Muscle weakness.  If your baby has decreased activity.  Persistent diarrhea.  Abnormal vaginal discharge.  Uterine contractions at 20-minute intervals.  Back pain that travels down your leg. L Learn and practice that what you eat and drink should be in moderation and healthy for you and your baby. Legal drugs such as alcohol and caffeine are important issues for pregnant women. There is no safe amount of alcohol a woman can drink while pregnant. Fetal alcohol syndrome, a disorder characterized by growth retardation, facial abnormalities, and central nervous system dysfunction, is caused by a woman's use of alcohol during pregnancy. Caffeine, found in tea, coffee, soft drinks and chocolate, should also be limited. Be sure to read labels when trying to cut down on caffeine during pregnancy. More than 200 foods, beverages, and over-the-counter medications contain caffeine and have a high salt content! There are coffees and teas that do not contain caffeine. M Medical conditions such as diabetes, epilepsy, and high blood pressure should be treated and kept under control before pregnancy when possible, but especially during pregnancy. Ask your caregiver about any medications that may need to be  changed or adjusted during pregnancy. If you are currently taking any medications, ask your caregiver if it is safe to take them while you are pregnant or before getting pregnant when possible. Also, be sure to discuss any herbs or vitamins you are taking. They are medicines, too! Discuss with your doctor all medications, prescribed and over-the-counter, that you are taking. During your prenatal visit, discuss the medications your doctor may give you during labor and delivery. N Never be afraid to ask your doctor or caregiver questions about your health, the progress of the pregnancy, family problems, stressful situations, and recommendation for a pediatrician, if you do not have one. It is better to take all precautions and discuss any questions or concerns you may have during your office visits. It is a good idea to write down your questions before you visit the doctor. O Over-the-counter cough and cold remedies may contain alcohol or other ingredients that should be avoided during pregnancy. Ask your caregiver about prescription, herbs or over-the-counter medications that you are taking or may consider taking while pregnant.  P  Physical activity during pregnancy can benefit both you and your baby by lessening discomfort and fatigue, providing a sense of well-being, and increasing the likelihood of early recovery after delivery. Light to moderate exercise during pregnancy strengthens the belly (abdominal) and back muscles. This helps improve posture. Practicing yoga, walking, swimming, and cycling on a stationary bicycle are usually safe exercises for pregnant women. Avoid scuba diving, exercise at high altitudes (over 3000 feet), skiing, horseback riding, contact sports, etc. Always check with your doctor before beginning any kind of exercise, especially during pregnancy and especially if you did not exercise before getting pregnant. Q Queasiness, stomach upset and morning sickness are common during  pregnancy. Eating a couple of crackers or dry toast before getting out of bed. Foods that you normally love may make you feel sick to your stomach. You may need to substitute other nutritious foods. Eating 5 or 6 small meals a day instead of 3 large ones may make you feel better. Do not drink with your meals, drink between meals. Questions that you have should be written down and asked during your prenatal visits. R Read about and make plans to baby-proof your home. There are important tips for making your home a safer environment for your baby. Review the tips and make your home safer for you and your baby. Read food labels regarding calories, salt and fat content in the food. S Saunas, hot tubs, and steam rooms should be avoided while you are pregnant. Excessive high heat may be harmful during your pregnancy. Your caregiver will screen and examine you for sexually transmitted diseases and genetic disorders during your prenatal visits. Learn the signs of labor. Sexual relations while pregnant is safe unless there is a medical or pregnancy problem and your caregiver advises against it. T Traveling long distances should be avoided especially in the third trimester of your pregnancy. If you do have to travel out of state, be sure to take a copy of your medical records and medical insurance plan with you. You should not travel long distances without seeing your doctor first. Most airlines will not allow you to travel after 36 weeks of pregnancy. Toxoplasmosis is an infection caused by a parasite that can seriously harm an unborn baby. Avoid eating undercooked meat and handling cat litter. Be sure to wear gloves when gardening. Tingling of the hands and fingers is not unusual and is due to fluid retention. This will go away after the baby is born. U Womb (uterus) size increases during the first trimester. Your kidneys will begin to function more efficiently. This may cause you to feel the need to urinate more  often. You may also leak urine when sneezing, coughing or laughing. This is due to the growing uterus pressing against your bladder, which lies directly in front of and slightly under the uterus during the first few months of pregnancy. If you experience burning along with frequency of urination or bloody urine, be sure to tell your doctor. The size of your uterus in the third trimester may cause a problem with your balance. It is advisable to maintain good posture and avoid wearing high heels during this time. An ultrasound of your baby may be necessary during your pregnancy and is safe for you and your baby. V Vaccinations are an important concern for pregnant women. Get needed vaccines before pregnancy. Center for Disease Control ([employer]) has clear guidelines for the use of vaccines during pregnancy. Review the list, be sure to discuss it with your  doctor. Prenatal vitamins are helpful and healthy for you and the baby. Do not take extra vitamins except what is recommended. Taking too much of certain vitamins can cause overdose problems. Continuous vomiting should be reported to your caregiver. Varicose veins may appear especially if there is a family history of varicose veins. They should subside after the delivery of the baby. Support hose helps if there is leg discomfort. W Being overweight or underweight during pregnancy may cause problems. Try to get within 15 pounds of your ideal weight before pregnancy. Remember, pregnancy is not a time to be dieting! Do not stop eating or start skipping meals as your weight increases. Both you and your baby need the calories and nutrition you receive from a healthy diet. Be sure to consult with your doctor about your diet. There is a formula and diet plan available depending on whether you are overweight or underweight. Your caregiver or nutritionist can help and advise you if necessary. X Avoid X-rays. If you must have dental work or diagnostic tests, tell your  dentist or physician that you are pregnant so that extra care can be taken. X-rays should only be taken when the risks of not taking them outweigh the risk of taking them. If needed, only the minimum amount of radiation should be used. When X-rays are necessary, protective lead shields should be used to cover areas of the body that are not being X-rayed. Y Your baby loves you. Breastfeeding your baby creates a loving and very close bond between the two of you. Give your baby a healthy environment to live in while you are pregnant. Infants and children require constant care and guidance. Their health and safety should be carefully watched at all times. After the baby is born, rest or take a nap when the baby is sleeping. Z Get your ZZZs. Be sure to get plenty of rest. Resting on your side as often as possible, especially on your left side is advised. It provides the best circulation to your baby and helps reduce swelling. Try taking a nap for 30 to 45 minutes in the afternoon when possible. After the baby is born rest or take a nap when the baby is sleeping. Try elevating your feet for that amount of time when possible. It helps the circulation in your legs and helps reduce swelling.  Most information courtesy of the CDC. Document Released: 04/17/2005 Document Revised: 07/10/2011 Document Reviewed: 12/30/2008 [facility] Patient Information 2013 [place], [place].

## 2012-07-08 DIAGNOSIS — Z3490 Encounter for supervision of normal pregnancy, unspecified, unspecified trimester: Secondary | ICD-10-CM | POA: Insufficient documentation

## 2012-07-08 NOTE — Progress Notes (Signed)
  Subjective:    Patient ID: Melissa Shelton, female    DOB: November 24, 1985, 27 y.o.   MRN: [id number]  HPI Patient here to establish unplanned by welcomed pregnancy.  Here today with father of baby.  G3P2 with history of one pregnancy termination  LMP Jan 28.  Was a normal period in timing and duration.  Has not started PNV.  Has been in usual state of health No abdominal pain, bleeding, vaginal discharge  Review of Systems See HPI    Objective:   Physical Exam  GEN: NAD       Assessment & Plan:

## 2012-07-08 NOTE — Assessment & Plan Note (Addendum)
New diagnosis pregnancy, dated by reliable LMP at 5 weeks 6 days today.  Doing well.   Discussed folic acid supplementation, patient would like to take childrens vitamins to meet this need.  Will schedule for OB labwork and initial OB appointment

## 2012-07-15 ENCOUNTER — Telehealth: Payer: Self-pay | Admitting: *Deleted

## 2012-07-15 NOTE — Telephone Encounter (Signed)
Patient states she started spotting on 03/14 . When she wipes there iwas  a pink discharge on 03/14 and 03/15. On 3/16 bleeding was  more red but today again is pink.  No pain or cramping. Consulted with Dr. Earnest Bailey and she advises that if she has any cramping or pain should go to [facility] but if just the spotting plan to see her here in office tomorrow.  Patient requests appointment after 1:00. Appointment scheduled with Dr. Cristal Ford tomorrow.

## 2012-07-16 ENCOUNTER — Ambulatory Visit (INDEPENDENT_AMBULATORY_CARE_PROVIDER_SITE_OTHER): Payer: Managed Care, Other (non HMO) | Admitting: Family Medicine

## 2012-07-16 ENCOUNTER — Encounter: Payer: Self-pay | Admitting: Family Medicine

## 2012-07-16 VITALS — BP 114/62 | Temp 98.1°F | Wt 138.8 lb

## 2012-07-16 DIAGNOSIS — O209 Hemorrhage in early pregnancy, unspecified: Secondary | ICD-10-CM

## 2012-07-16 LAB — CBC
HCT: 39.8 % (ref 36.0–46.0)
Hemoglobin: 13.3 g/dL (ref 12.0–15.0)
RDW: 14.6 % (ref 11.5–15.5)
WBC: 9.1 10*3/uL (ref 4.0–10.5)

## 2012-07-16 LAB — POCT WET PREP (WET MOUNT): Clue Cells Wet Prep Whiff POC: POSITIVE

## 2012-07-16 NOTE — Progress Notes (Signed)
7 week pregnancy confirmed with urine testing. C/o spotting for past 5 days. This started after a GI illness with nausea and emesis lasting 2 days and resolved last Thursday. Spotting started on Friday. Sunday noted slight heavier bleeding requiring a panti-liner only.  Denies any recent intercourse, pelvic/abdominal pain, cramping, fever, chills, diarrhea, dysuria, discharge, malaise, trauma. Hx of 2 abnormal paps, both followed by colposcopies reportedly normal. Her last pap in 4/13 was normal.  Last intercourse 2 weeks ago.  PE: well-appearing female HEENT: mmm CV: RRR ABD: NTND, BS normal GYN: scant blood from cervical os, appears otherwise closed without products of conception. No cervical motion tenderness or masses palpated. Mucosa wnl. Cervix without lesion.  A/P: 1st trimester bleeding, probable threatened abortion. No pain or systemic signs to indicate infection or ectopic. Will check serial b-HCG, CBC, wet prep, GC/Ch. F/u HCG in 2 days. Consider scheduling [place] if develops pain or other concerns. Discussed red flags to seek emergency care.  Case d/w Dr. Leveda Anna.

## 2012-07-16 NOTE — Patient Instructions (Addendum)
Get your OB ultrasound done. Make appointment for Thursday lab testing.  If you have pain, worsened bleeding, fever, or other concerning symptoms then go to [facility] or ER.  Vaginal Bleeding During Pregnancy A small amount of bleeding from the vagina can happen anytime during pregnancy. Be sure to tell your doctor about all vaginal bleeding.  HOME CARE  Get plenty of rest and sleep.  Count the number of pads you use each day. Do not use tampons.  Save any tissue you pass for your doctor to see.  Do not exercise  Do not do any heavy lifting.  Avoid going up and down stairs. If you must climb stairs, go slowly.  Do not have sex (intercourse) or orgasms until approved by your doctor.  Do not douche.  Only take medicine as told by your doctor. Do not take aspirin.  Eat healthy.  Always keep your follow-up appointments. GET HELP RIGHT AWAY IF:   You feel the baby moving less or not moving at all.  The bleeding gets worse.  You have very painful cramps or pain in your stomach or back.  You pass large clots or anything that looks like tissue.  You have a temperature by mouth above 102 F (38.9 C).  You feel very weak.  You have chills.  You feel dizzy or pass out (faint).  You have a gush of fluid from the vagina. MAKE SURE YOU:   Understand these instructions.  Will watch your condition.  Will get help right away if you are not doing well or get worse. Document Released: 01/25/2008 Document Revised: 07/10/2011 Document Reviewed: 03/23/2009 [facility] Patient Information 2013 [place], [place].

## 2012-07-17 LAB — HCG, QUANTITATIVE, PREGNANCY: hCG, Beta Chain, Quant, S: 53800.7 m[IU]/mL

## 2012-07-18 ENCOUNTER — Other Ambulatory Visit: Payer: Managed Care, Other (non HMO)

## 2012-07-18 DIAGNOSIS — O209 Hemorrhage in early pregnancy, unspecified: Secondary | ICD-10-CM

## 2012-07-18 NOTE — Progress Notes (Signed)
HCG AND ABORH DONE TODAY

## 2012-07-19 ENCOUNTER — Telehealth: Payer: Self-pay | Admitting: *Deleted

## 2012-07-19 ENCOUNTER — Other Ambulatory Visit: Payer: Self-pay | Admitting: Family Medicine

## 2012-07-19 DIAGNOSIS — O209 Hemorrhage in early pregnancy, unspecified: Secondary | ICD-10-CM

## 2012-07-19 LAB — ABO AND RH: Rh Type: POSITIVE

## 2012-07-19 NOTE — Telephone Encounter (Signed)
Done

## 2012-07-19 NOTE — Telephone Encounter (Signed)
Dr Cristal Ford pt informed,she's schedule for an ultra sound on 07/22/2012 @ 3:45.Radiology request a change on the order they state its incorrect.They request order to be OB COMPLETE LESS 14 WEEKS in order for them to get paid.thank you.[name], [name]

## 2012-07-22 ENCOUNTER — Ambulatory Visit (HOSPITAL_COMMUNITY)
Admission: RE | Admit: 2012-07-22 | Discharge: 2012-07-22 | Disposition: A | Discharge status: Home / Self Care | Payer: Managed Care, Other (non HMO) | Source: Ambulatory Visit | Attending: Family Medicine | Admitting: Family Medicine

## 2012-07-22 DIAGNOSIS — O99330 Smoking (tobacco) complicating pregnancy, unspecified trimester: Secondary | ICD-10-CM | POA: Insufficient documentation

## 2012-07-22 DIAGNOSIS — Z3689 Encounter for other specified antenatal screening: Secondary | ICD-10-CM | POA: Insufficient documentation

## 2012-07-22 DIAGNOSIS — O209 Hemorrhage in early pregnancy, unspecified: Secondary | ICD-10-CM

## 2012-07-24 NOTE — Telephone Encounter (Signed)
Called to report normal dating ultrasound confirms pregnancy and date c/w LMP. She has a f/u appt for new OB visit.

## 2012-07-25 ENCOUNTER — Other Ambulatory Visit: Payer: Managed Care, Other (non HMO)

## 2012-07-25 DIAGNOSIS — Z331 Pregnant state, incidental: Secondary | ICD-10-CM

## 2012-07-25 NOTE — Progress Notes (Signed)
PRENATAL LABS DONE TODAY Melissa Shelton 

## 2012-07-26 LAB — OBSTETRIC PANEL
Antibody Screen: NEGATIVE
Basophils Absolute: 0 10*3/uL (ref 0.0–0.1)
Basophils Relative: 0 % (ref 0–1)
HCT: 39.4 % (ref 36.0–46.0)
Hepatitis B Surface Ag: NEGATIVE
Lymphocytes Relative: 16 % (ref 12–46)
MCHC: 34.3 g/dL (ref 30.0–36.0)
Neutro Abs: 5.9 10*3/uL (ref 1.7–7.7)
Neutrophils Relative %: 71 % (ref 43–77)
Platelets: 212 10*3/uL (ref 150–400)
RDW: 14.6 % (ref 11.5–15.5)
Rubella: 4.23 Index — ABNORMAL HIGH (ref <0.90)
WBC: 8.3 10*3/uL (ref 4.0–10.5)

## 2012-08-01 ENCOUNTER — Other Ambulatory Visit (HOSPITAL_COMMUNITY)
Admission: RE | Admit: 2012-08-01 | Discharge: 2012-08-01 | Disposition: A | Discharge status: Home / Self Care | Payer: Managed Care, Other (non HMO) | Source: Ambulatory Visit | Attending: Family Medicine | Admitting: Family Medicine

## 2012-08-01 ENCOUNTER — Encounter: Payer: Self-pay | Admitting: Sports Medicine

## 2012-08-01 ENCOUNTER — Ambulatory Visit (INDEPENDENT_AMBULATORY_CARE_PROVIDER_SITE_OTHER): Payer: Managed Care, Other (non HMO) | Admitting: Sports Medicine

## 2012-08-01 VITALS — BP 112/70 | Wt 140.8 lb

## 2012-08-01 DIAGNOSIS — N898 Other specified noninflammatory disorders of vagina: Secondary | ICD-10-CM

## 2012-08-01 DIAGNOSIS — Z01419 Encounter for gynecological examination (general) (routine) without abnormal findings: Secondary | ICD-10-CM | POA: Insufficient documentation

## 2012-08-01 DIAGNOSIS — B373 Candidiasis of vulva and vagina: Secondary | ICD-10-CM

## 2012-08-01 DIAGNOSIS — Z331 Pregnant state, incidental: Secondary | ICD-10-CM

## 2012-08-01 DIAGNOSIS — B3731 Acute candidiasis of vulva and vagina: Secondary | ICD-10-CM

## 2012-08-01 DIAGNOSIS — Z3490 Encounter for supervision of normal pregnancy, unspecified, unspecified trimester: Secondary | ICD-10-CM

## 2012-08-01 DIAGNOSIS — N879 Dysplasia of cervix uteri, unspecified: Secondary | ICD-10-CM

## 2012-08-01 LAB — POCT WET PREP (WET MOUNT)
Clue Cells Wet Prep Whiff POC: POSITIVE
WBC, Wet Prep HPF POC: >20

## 2012-08-01 NOTE — Patient Instructions (Addendum)
Please return in 3 weeks for your next prenatal visit.    ABCs of Pregnancy A Antepartum care is very important. Be sure you see your doctor and get prenatal care as soon as you think you are pregnant. At this time, you will be tested for infection, genetic abnormalities and potential problems with you and the pregnancy. This is the time to discuss diet, exercise, work, medications, labor, pain medication during labor and the possibility of a cesarean delivery. Ask any questions that may concern you. It is important to see your doctor regularly throughout your pregnancy. Avoid exposure to toxic substances and chemicals - such as cleaning solvents, lead and mercury, some insecticides, and paint. Pregnant women should avoid exposure to paint fumes, and fumes that cause you to feel ill, dizzy or faint. When possible, it is a good idea to have a pre-pregnancy consultation with your caregiver to begin some important recommendations your caregiver suggests such as, taking folic acid, exercising, quitting smoking, avoiding alcoholic beverages, etc. B Breastfeeding is the healthiest choice for both you and your baby. It has many nutritional benefits for the baby and health benefits for the mother. It also creates a very tight and loving bond between the baby and mother. Talk to your doctor, your family and friends, and your employer about how you choose to feed your baby and how they can support you in your decision. Not all birth defects can be prevented, but a woman can take actions that may increase her chance of having a healthy baby. Many birth defects happen very early in pregnancy, sometimes before a woman even knows she is pregnant. Birth defects or abnormalities of any child in your or the father's family should be discussed with your caregiver. Get a good support bra as your breast size changes. Wear it especially when you exercise and when nursing.  C Celebrate the news of your pregnancy with the your  spouse/father and family. Childbirth classes are helpful to take for you and the spouse/father because it helps to understand what happens during the pregnancy, labor and delivery. Cesarean delivery should be discussed with your doctor so you are prepared for that possibility. The pros and cons of circumcision if it is a boy, should be discussed with your pediatrician. Cigarette smoking during pregnancy can result in low birth weight babies. It has been associated with infertility, miscarriages, tubal pregnancies, infant death (mortality) and poor health (morbidity) in childhood. Additionally, cigarette smoking may cause long-term learning disabilities. If you smoke, you should try to quit before getting pregnant and not smoke during the pregnancy. Secondary smoke may also harm a mother and her developing baby. It is a good idea to ask people to stop smoking around you during your pregnancy and after the baby is born. Extra calcium is necessary when you are pregnant and is found in your prenatal vitamin, in dairy products, green leafy vegetables and in calcium supplements. D A healthy diet according to your current weight and height, along with vitamins and mineral supplements should be discussed with your caregiver. Domestic abuse or violence should be made known to your doctor right away to get the situation corrected. Drink more water when you exercise to keep hydrated. Discomfort of your back and legs usually develops and progresses from the middle of the second trimester through to delivery of the baby. This is because of the enlarging baby and uterus, which may also affect your balance. Do not take illegal drugs. Illegal drugs can seriously harm the  baby and you. Drink extra fluids (water is best) throughout pregnancy to help your body keep up with the increases in your blood volume. Drink at least 6 to 8 glasses of water, fruit juice, or milk each day. A good way to know you are drinking enough fluid is  when your urine looks almost like clear water or is very light yellow.  E Eat healthy to get the nutrients you and your unborn baby need. Your meals should include the five basic food groups. Exercise (30 minutes of light to moderate exercise a day) is important and encouraged during pregnancy, if there are no medical problems or problems with the pregnancy. Exercise that causes discomfort or dizziness should be stopped and reported to your caregiver. Emotions during pregnancy can change from being ecstatic to depression and should be understood by you, your partner and your family. F Fetal screening with ultrasound, amniocentesis and monitoring during pregnancy and labor is common and sometimes necessary. Take 400 micrograms of folic acid daily both before, when possible, and during the first few months of pregnancy to reduce the risk of birth defects of the brain and spine. All women who could possibly become pregnant should take a vitamin with folic acid, every day. It is also important to eat a healthy diet with fortified foods (enriched grain products, including cereals, rice, breads, and pastas) and foods with natural sources of folate (orange juice, green leafy vegetables, beans, peanuts, broccoli, asparagus, peas, and lentils). The father should be involved with all aspects of the pregnancy including, the prenatal care, childbirth classes, labor, delivery, and postpartum time. Fathers may also have emotional concerns about being a father, financial needs, and raising a family. G Genetic testing should be done appropriately. It is important to know your family and the father's history. If there have been problems with pregnancies or birth defects in your family, report these to your doctor. Also, genetic counselors can talk with you about the information you might need in making decisions about having a family. You can call a major medical center in your area for help in finding a board-certified  genetic counselor. Genetic testing and counseling should be done before pregnancy when possible, especially if there is a history of problems in the mother's or father's family. Certain ethnic backgrounds are more at risk for genetic defects. H Get familiar with the hospital where you will be having your baby. Get to know how long it takes to get there, the labor and delivery area, and the hospital procedures. Be sure your medical insurance is accepted there. Get your home ready for the baby including, clothes, the baby's room (when possible), furniture and car seat. Hand washing is important throughout the day, especially after handling raw meat and poultry, changing the baby's diaper or using the bathroom. This can help prevent the spread of many bacteria and viruses that cause infection. Your hair may become dry and thinner, but will return to normal a few weeks after the baby is born. Heartburn is a common problem that can be treated by taking antacids recommended by your caregiver, eating smaller meals 5 or 6 times a day, not drinking liquids when eating, drinking between meals and raising the head of your bed 2 to 3 inches. I Insurance to cover you, the baby, doctor and hospital should be reviewed so that you will be prepared to pay any costs not covered by your insurance plan. If you do not have medical insurance, there are usually clinics and  services available for you in your community. Take 30 milligrams of iron during your pregnancy as prescribed by your doctor to reduce the risk of low red blood cells (anemia) later in pregnancy. All women of childbearing age should eat a diet rich in iron. J There should be a joint effort for the mother, father and any other children to adapt to the pregnancy financially, emotionally, and psychologically during the pregnancy. Join a support group for moms-to-be. Or, join a class on parenting or childbirth. Have the family participate when possible. K Know your  limits. Let your caregiver know if you experience any of the following:   Pain of any kind.  Strong cramps.  You develop a lot of weight in a short period of time (5 pounds in 3 to 5 days).  Vaginal bleeding, leaking of amniotic fluid.  Headache, vision problems.  Dizziness, fainting, shortness of breath.  Chest pain.  Fever of 102 F (38.9 C) or higher.  Gush of clear fluid from your vagina.  Painful urination.  Domestic violence.  Irregular heartbeat (palpitations).  Rapid beating of the heart (tachycardia).  Constant feeling sick to your stomach (nauseous) and vomiting.  Trouble walking, fluid retention (edema).  Muscle weakness.  If your baby has decreased activity.  Persistent diarrhea.  Abnormal vaginal discharge.  Uterine contractions at 20-minute intervals.  Back pain that travels down your leg. L Learn and practice that what you eat and drink should be in moderation and healthy for you and your baby. Legal drugs such as alcohol and caffeine are important issues for pregnant women. There is no safe amount of alcohol a woman can drink while pregnant. Fetal alcohol syndrome, a disorder characterized by growth retardation, facial abnormalities, and central nervous system dysfunction, is caused by a woman's use of alcohol during pregnancy. Caffeine, found in tea, coffee, soft drinks and chocolate, should also be limited. Be sure to read labels when trying to cut down on caffeine during pregnancy. More than 200 foods, beverages, and over-the-counter medications contain caffeine and have a high salt content! There are coffees and teas that do not contain caffeine. M Medical conditions such as diabetes, epilepsy, and high blood pressure should be treated and kept under control before pregnancy when possible, but especially during pregnancy. Ask your caregiver about any medications that may need to be changed or adjusted during pregnancy. If you are currently taking any  medications, ask your caregiver if it is safe to take them while you are pregnant or before getting pregnant when possible. Also, be sure to discuss any herbs or vitamins you are taking. They are medicines, too! Discuss with your doctor all medications, prescribed and over-the-counter, that you are taking. During your prenatal visit, discuss the medications your doctor may give you during labor and delivery. N Never be afraid to ask your doctor or caregiver questions about your health, the progress of the pregnancy, family problems, stressful situations, and recommendation for a pediatrician, if you do not have one. It is better to take all precautions and discuss any questions or concerns you may have during your office visits. It is a good idea to write down your questions before you visit the doctor. O Over-the-counter cough and cold remedies may contain alcohol or other ingredients that should be avoided during pregnancy. Ask your caregiver about prescription, herbs or over-the-counter medications that you are taking or may consider taking while pregnant.  P Physical activity during pregnancy can benefit both you and your baby by lessening  discomfort and fatigue, providing a sense of well-being, and increasing the likelihood of early recovery after delivery. Light to moderate exercise during pregnancy strengthens the belly (abdominal) and back muscles. This helps improve posture. Practicing yoga, walking, swimming, and cycling on a stationary bicycle are usually safe exercises for pregnant women. Avoid scuba diving, exercise at high altitudes (over 3000 feet), skiing, horseback riding, contact sports, etc. Always check with your doctor before beginning any kind of exercise, especially during pregnancy and especially if you did not exercise before getting pregnant. Q Queasiness, stomach upset and morning sickness are common during pregnancy. Eating a couple of crackers or dry toast before getting out of  bed. Foods that you normally love may make you feel sick to your stomach. You may need to substitute other nutritious foods. Eating 5 or 6 small meals a day instead of 3 large ones may make you feel better. Do not drink with your meals, drink between meals. Questions that you have should be written down and asked during your prenatal visits. R Read about and make plans to baby-proof your home. There are important tips for making your home a safer environment for your baby. Review the tips and make your home safer for you and your baby. Read food labels regarding calories, salt and fat content in the food. S Saunas, hot tubs, and steam rooms should be avoided while you are pregnant. Excessive high heat may be harmful during your pregnancy. Your caregiver will screen and examine you for sexually transmitted diseases and genetic disorders during your prenatal visits. Learn the signs of labor. Sexual relations while pregnant is safe unless there is a medical or pregnancy problem and your caregiver advises against it. T Traveling long distances should be avoided especially in the third trimester of your pregnancy. If you do have to travel out of state, be sure to take a copy of your medical records and medical insurance plan with you. You should not travel long distances without seeing your doctor first. Most airlines will not allow you to travel after 36 weeks of pregnancy. Toxoplasmosis is an infection caused by a parasite that can seriously harm an unborn baby. Avoid eating undercooked meat and handling cat litter. Be sure to wear gloves when gardening. Tingling of the hands and fingers is not unusual and is due to fluid retention. This will go away after the baby is born. U Womb (uterus) size increases during the first trimester. Your kidneys will begin to function more efficiently. This may cause you to feel the need to urinate more often. You may also leak urine when sneezing, coughing or laughing. This is  due to the growing uterus pressing against your bladder, which lies directly in front of and slightly under the uterus during the first few months of pregnancy. If you experience burning along with frequency of urination or bloody urine, be sure to tell your doctor. The size of your uterus in the third trimester may cause a problem with your balance. It is advisable to maintain good posture and avoid wearing high heels during this time. An ultrasound of your baby may be necessary during your pregnancy and is safe for you and your baby. V Vaccinations are an important concern for pregnant women. Get needed vaccines before pregnancy. Center for Disease Control ([employer]) has clear guidelines for the use of vaccines during pregnancy. Review the list, be sure to discuss it with your doctor. Prenatal vitamins are helpful and healthy for you and the baby. Do  not take extra vitamins except what is recommended. Taking too much of certain vitamins can cause overdose problems. Continuous vomiting should be reported to your caregiver. Varicose veins may appear especially if there is a family history of varicose veins. They should subside after the delivery of the baby. Support hose helps if there is leg discomfort. W Being overweight or underweight during pregnancy may cause problems. Try to get within 15 pounds of your ideal weight before pregnancy. Remember, pregnancy is not a time to be dieting! Do not stop eating or start skipping meals as your weight increases. Both you and your baby need the calories and nutrition you receive from a healthy diet. Be sure to consult with your doctor about your diet. There is a formula and diet plan available depending on whether you are overweight or underweight. Your caregiver or nutritionist can help and advise you if necessary. X Avoid X-rays. If you must have dental work or diagnostic tests, tell your dentist or physician that you are pregnant so that extra care can be taken.  X-rays should only be taken when the risks of not taking them outweigh the risk of taking them. If needed, only the minimum amount of radiation should be used. When X-rays are necessary, protective lead shields should be used to cover areas of the body that are not being X-rayed. Y Your baby loves you. Breastfeeding your baby creates a loving and very close bond between the two of you. Give your baby a healthy environment to live in while you are pregnant. Infants and children require constant care and guidance. Their health and safety should be carefully watched at all times. After the baby is born, rest or take a nap when the baby is sleeping. Z Get your ZZZs. Be sure to get plenty of rest. Resting on your side as often as possible, especially on your left side is advised. It provides the best circulation to your baby and helps reduce swelling. Try taking a nap for 30 to 45 minutes in the afternoon when possible. After the baby is born rest or take a nap when the baby is sleeping. Try elevating your feet for that amount of time when possible. It helps the circulation in your legs and helps reduce swelling.  Most information courtesy of the CDC. Document Released: 04/17/2005 Document Revised: 07/10/2011 Document Reviewed: 12/30/2008 [facility] Patient Information 2013 [place], [place].

## 2012-08-01 NOTE — Progress Notes (Addendum)
Subjective:    ANJELINA DUNG is being seen today for her first obstetrical visit.  This is an unplanned but welcomed pregnancy. She is at [redacted]w[redacted]d gestation. Her obstetrical history is significant for smoker and but has now quit. Relationship with FOB: significant other, not living together. Patient does intend to breast feed. Pregnancy history fully reviewed.  Menstrual History: OB History   Grav Para Term Preterm Abortions TAB SAB Ect Mult Living   4 2 2  1 1    2       Patient's last menstrual period was 05/28/2012.    The following portions of the patient's history were reviewed and updated as appropriate: allergies, current medications, past family history, past medical history, past social history, past surgical history and problem list.  Review of Systems Pt reports feeling well overall.  No concerns.  No N/V.  Some mild swelling that occurs after standing for prolonged periods.  Has been able to quit smoking cold [place] since finding out she was pregnant.      Objective:    BP 112/70  Wt 140 lb 12.8 oz (63.866 kg)  BMI 23.01 kg/m2  LMP 05/28/2012  General Appearance:    Alert, cooperative, no distress, appears stated age  Head:    Normocephalic, without obvious abnormality, atraumatic  Eyes:    PERRL, conjunctiva/corneas clear, EOM's intact, fundi    benign, both eyes  Ears:    Normal TM's and external ear canals, both ears  Nose:   Nares normal, septum midline, mucosa normal, no drainage    or sinus tenderness  Throat:   Lips, mucosa, and tongue normal, poor dentitionl, tongue ring in place  Neck:   Supple, symmetrical, trachea midline, no adenopathy;    thyroid:  no enlargement/tenderness/nodules; no carotid   bruit or JVD  Back:     Symmetric, no curvature, ROM normal, no CVA tenderness  Lungs:     Clear to auscultation bilaterally, respirations unlabored  Chest Wall:    No tenderness or deformity   Heart:    Regular rate and rhythm, S1 and S2 normal, no murmur, rub   or  gallop  Abdomen:     Soft, non-tender, bowel sounds active all four quadrants,    no masses, no organomegaly, gravid  Genitalia:    Normal female without lesion, discharge or tenderness.  PAP performed today.  Some thick white discharge noted, WET PREP COLLECTED  Extremities:   Extremities normal, atraumatic, no cyanosis or edema    PSYCH: PHQ-9 - negative - some fatigue   Assessment:    Pregnancy at 9 and 2/7 weeks    Plan:    Initial labs reviewed and unremarkable Prenatal vitamins. Problem list reviewed and updated. AFP3 discussed: requested. Role of ultrasound in pregnancy discussed; fetal survey: requested. Amniocentesis discussed: declined. Follow up in 3 weeks. 50% of 45 min visit spent on counseling and coordination of care.

## 2012-08-01 NOTE — Assessment & Plan Note (Signed)
PAP obtained today,

## 2012-08-01 NOTE — Assessment & Plan Note (Addendum)
Checked Wet Prep (some discharge), PAP, GC Chlamydia today Poor dentition - pt to follow up with Dentistry in next 2-4 weeks Continue prenatal vitamin Pt is private insurance but is going to apply for pregnancy medicaid, screening questionnaire negative for concerns.  Consider referral to [facility] if pt unable to get medicaid and develops any worsening social issues

## 2012-08-02 DIAGNOSIS — B373 Candidiasis of vulva and vagina: Secondary | ICD-10-CM | POA: Insufficient documentation

## 2012-08-02 MED ORDER — FLUCONAZOLE 150 MG PO TABS: 150.0000 mg | ORAL_TABLET | Freq: Once | ORAL | Status: DC

## 2012-08-02 NOTE — Assessment & Plan Note (Signed)
Given +Yeast will treat initially for yeast.  Will treat with diflucan X 2. Would like to recheck for BV at next visit or if worsening symptoms. Encouraged probiotics

## 2012-08-02 NOTE — Addendum Note (Signed)
Addended by: Gaspar Bidding D on: 08/02/2012 05:35 PM   Modules accepted: Orders

## 2012-08-09 ENCOUNTER — Encounter: Payer: Self-pay | Admitting: Sports Medicine

## 2012-08-14 ENCOUNTER — Telehealth: Payer: Self-pay | Admitting: Sports Medicine

## 2012-08-14 ENCOUNTER — Encounter: Payer: Self-pay | Admitting: Sports Medicine

## 2012-08-14 NOTE — Telephone Encounter (Signed)
Needs a letter stating that she is pregnant and when her due date is so she can take it to social services - needs today please  Also try to call [phone]

## 2012-08-14 NOTE — Telephone Encounter (Signed)
Spoke with patient and informed her that letter is up front for pick up 

## 2012-09-02 ENCOUNTER — Ambulatory Visit (INDEPENDENT_AMBULATORY_CARE_PROVIDER_SITE_OTHER): Payer: Managed Care, Other (non HMO) | Admitting: Sports Medicine

## 2012-09-02 VITALS — BP 120/63 | Temp 99.1°F | Wt 142.0 lb

## 2012-09-02 DIAGNOSIS — O99345 Other mental disorders complicating the puerperium: Secondary | ICD-10-CM | POA: Insufficient documentation

## 2012-09-02 DIAGNOSIS — O99342 Other mental disorders complicating pregnancy, second trimester: Secondary | ICD-10-CM

## 2012-09-02 DIAGNOSIS — O99344 Other mental disorders complicating childbirth: Secondary | ICD-10-CM

## 2012-09-02 NOTE — Assessment & Plan Note (Signed)
Overall in discussing her mood with her she does report significant anhedonia, and decreased energy level and feelings of hopelessness.  Patient did report after talking she felt significantly better.  We discussed the merits of pharmacotherapy in the patient wishes to pursue non-pharmacologic options at this time.  We did review flags for reevaluation the patient currently denies SI/HI.  She has been able to go to her job and has not missed work due to her mood.  She is able to continue to care for her children.  Patient will be referred to the pregnancy home program for followup and counseling intervention.

## 2012-09-02 NOTE — Progress Notes (Addendum)
Unable to arrange Integrated screen today.  Will plan to obtain quad screen in 3 weeks. Patient did volunteer that she's been having increased issues with her energy level and mood being down.  She does report some depression.  She reports a lot going on her life including her FOB being in jail due to probation violations.  She is a single mother who works full-time job and has concerns securing child care on occasion.   She has applied for Medicaid and has been accepted. Has totally quit smoking.  Taking prenatal vitamin. The baby is active when obtaining Doppler HR.  Mom afebrile Overall pregnancy is going well.  Return to care in 3 weeks.

## 2012-09-02 NOTE — Patient Instructions (Addendum)
We are planning on checking labs in 3 weeks for the QUAD screen.  This can be performed between 16 and 20 weeks.  Please return in 3 weeks to see me.  You should be hearing from the medicaid pregnancy home coordinator in the next couple of weeks.  Pregnancy - Second Trimester The second trimester of pregnancy (3 to 6 months) is a period of rapid growth for you and your baby. At the end of the sixth month, your baby is about 9 inches long and weighs 1 1/2 pounds. You will begin to feel the baby move between 18 and 20 weeks of the pregnancy. This is called quickening. Weight gain is faster. A clear fluid (colostrum) may leak out of your breasts. You may feel small contractions of the womb (uterus). This is known as false labor or Braxton-Hicks contractions. This is like a practice for labor when the baby is ready to be born. Usually, the problems with morning sickness have usually passed by the end of your first trimester. Some women develop small dark blotches (called cholasma, mask of pregnancy) on their face that usually goes away after the baby is born. Exposure to the sun makes the blotches worse. Acne may also develop in some pregnant women and pregnant women who have acne, may find that it goes away. PRENATAL EXAMS  Blood work may continue to be done during prenatal exams. These tests are done to check on your health and the probable health of your baby. Blood work is used to follow your blood levels (hemoglobin). Anemia (low hemoglobin) is common during pregnancy. Iron and vitamins are given to help prevent this. You will also be checked for diabetes between 24 and 28 weeks of the pregnancy. Some of the previous blood tests may be repeated.  The size of the uterus is measured during each visit. This is to make sure that the baby is continuing to grow properly according to the dates of the pregnancy.  Your blood pressure is checked every prenatal visit. This is to make sure you are not getting  toxemia.  Your urine is checked to make sure you do not have an infection, diabetes or protein in the urine.  Your weight is checked often to make sure gains are happening at the suggested rate. This is to ensure that both you and your baby are growing normally.  Sometimes, an ultrasound is performed to confirm the proper growth and development of the baby. This is a test which bounces harmless sound waves off the baby so your caregiver can more accurately determine due dates. Sometimes, a specialized test is done on the amniotic fluid surrounding the baby. This test is called an amniocentesis. The amniotic fluid is obtained by sticking a needle into the belly (abdomen). This is done to check the chromosomes in instances where there is a concern about possible genetic problems with the baby. It is also sometimes done near the end of pregnancy if an early delivery is required. In this case, it is done to help make sure the baby's lungs are mature enough for the baby to live outside of the womb. CHANGES OCCURING IN THE SECOND TRIMESTER OF PREGNANCY Your body goes through many changes during pregnancy. They vary from person to person. Talk to your caregiver about changes you notice that you are concerned about.  During the second trimester, you will likely have an increase in your appetite. It is normal to have cravings for certain foods. This varies from person to  person and pregnancy to pregnancy.  Your lower abdomen will begin to bulge.  You may have to urinate more often because the uterus and baby are pressing on your bladder. It is also common to get more bladder infections during pregnancy (pain with urination). You can help this by drinking lots of fluids and emptying your bladder before and after intercourse.  You may begin to get stretch marks on your hips, abdomen, and breasts. These are normal changes in the body during pregnancy. There are no exercises or medications to take that prevent  this change.  You may begin to develop swollen and bulging veins (varicose veins) in your legs. Wearing support hose, elevating your feet for 15 minutes, 3 to 4 times a day and limiting salt in your diet helps lessen the problem.  Heartburn may develop as the uterus grows and pushes up against the stomach. Antacids recommended by your caregiver helps with this problem. Also, eating smaller meals 4 to 5 times a day helps.  Constipation can be treated with a stool softener or adding bulk to your diet. Drinking lots of fluids, vegetables, fruits, and whole grains are helpful.  Exercising is also helpful. If you have been very active up until your pregnancy, most of these activities can be continued during your pregnancy. If you have been less active, it is helpful to start an exercise program such as walking.  Hemorrhoids (varicose veins in the rectum) may develop at the end of the second trimester. Warm sitz baths and hemorrhoid cream recommended by your caregiver helps hemorrhoid problems.  Backaches may develop during this time of your pregnancy. Avoid heavy lifting, wear low heal shoes and practice good posture to help with backache problems.  Some pregnant women develop tingling and numbness of their hand and fingers because of swelling and tightening of ligaments in the wrist (carpel tunnel syndrome). This goes away after the baby is born.  As your breasts enlarge, you may have to get a bigger bra. Get a comfortable, cotton, support bra. Do not get a nursing bra until the last month of the pregnancy if you will be nursing the baby.  You may get a dark line from your belly button to the pubic area called the linea nigra.  You may develop rosy cheeks because of increase blood flow to the face.  You may develop spider looking lines of the face, neck, arms and chest. These go away after the baby is born. HOME CARE INSTRUCTIONS   It is extremely important to avoid all smoking, herbs, alcohol,  and unprescribed drugs during your pregnancy. These chemicals affect the formation and growth of the baby. Avoid these chemicals throughout the pregnancy to ensure the delivery of a healthy infant.  Most of your home care instructions are the same as suggested for the first trimester of your pregnancy. Keep your caregiver's appointments. Follow your caregiver's instructions regarding medication use, exercise and diet.  During pregnancy, you are providing food for you and your baby. Continue to eat regular, well-balanced meals. Choose foods such as meat, fish, milk and other low fat dairy products, vegetables, fruits, and whole-grain breads and cereals. Your caregiver will tell you of the ideal weight gain.  A physical sexual relationship may be continued up until near the end of pregnancy if there are no other problems. Problems could include early (premature) leaking of amniotic fluid from the membranes, vaginal bleeding, abdominal pain, or other medical or pregnancy problems.  Exercise regularly if there are no  restrictions. Check with your caregiver if you are unsure of the safety of some of your exercises. The greatest weight gain will occur in the last 2 trimesters of pregnancy. Exercise will help you:  Control your weight.  Get you in shape for labor and delivery.  Lose weight after you have the baby.  Wear a good support or jogging bra for breast tenderness during pregnancy. This may help if worn during sleep. Pads or tissues may be used in the bra if you are leaking colostrum.  Do not use hot tubs, steam rooms or saunas throughout the pregnancy.  Wear your seat belt at all times when driving. This protects you and your baby if you are in an accident.  Avoid raw meat, uncooked cheese, cat litter boxes and soil used by cats. These carry germs that can cause birth defects in the baby.  The second trimester is also a good time to visit your dentist for your dental health if this has not  been done yet. Getting your teeth cleaned is OK. Use a soft toothbrush. Brush gently during pregnancy.  It is easier to loose urine during pregnancy. Tightening up and strengthening the pelvic muscles will help with this problem. Practice stopping your urination while you are going to the bathroom. These are the same muscles you need to strengthen. It is also the muscles you would use as if you were trying to stop from passing gas. You can practice tightening these muscles up 10 times a set and repeating this about 3 times per day. Once you know what muscles to tighten up, do not perform these exercises during urination. It is more likely to contribute to an infection by backing up the urine.  Ask for help if you have financial, counseling or nutritional needs during pregnancy. Your caregiver will be able to offer counseling for these needs as well as refer you for other special needs.  Your skin may become oily. If so, wash your face with mild soap, use non-greasy moisturizer and oil or cream based makeup. MEDICATIONS AND DRUG USE IN PREGNANCY  Take prenatal vitamins as directed. The vitamin should contain 1 milligram of folic acid. Keep all vitamins out of reach of children. Only a couple vitamins or tablets containing iron may be fatal to a baby or young child when ingested.  Avoid use of all medications, including herbs, over-the-counter medications, not prescribed or suggested by your caregiver. Only take over-the-counter or prescription medicines for pain, discomfort, or fever as directed by your caregiver. Do not use aspirin.  Let your caregiver also know about herbs you may be using.  Alcohol is related to a number of birth defects. This includes fetal alcohol syndrome. All alcohol, in any form, should be avoided completely. Smoking will cause low birth rate and premature babies.  Street or illegal drugs are very harmful to the baby. They are absolutely forbidden. A baby born to an addicted  mother will be addicted at birth. The baby will go through the same withdrawal an adult does. SEEK MEDICAL CARE IF:  You have any concerns or worries during your pregnancy. It is better to call with your questions if you feel they cannot wait, rather than worry about them. SEEK IMMEDIATE MEDICAL CARE IF:   An unexplained oral temperature above 102 F (38.9 C) develops, or as your caregiver suggests.  You have leaking of fluid from the vagina (birth canal). If leaking membranes are suspected, take your temperature and tell your caregiver of  this when you call.  There is vaginal spotting, bleeding, or passing clots. Tell your caregiver of the amount and how many pads are used. Light spotting in pregnancy is common, especially following intercourse.  You develop a bad smelling vaginal discharge with a change in the color from clear to white.  You continue to feel sick to your stomach (nauseated) and have no relief from remedies suggested. You vomit blood or coffee ground-like materials.  You lose more than 2 pounds of weight or gain more than 2 pounds of weight over 1 week, or as suggested by your caregiver.  You notice swelling of your face, hands, feet, or legs.  You get exposed to [place] measles and have never had them.  You are exposed to fifth disease or chickenpox.  You develop belly (abdominal) pain. Round ligament discomfort is a common non-cancerous (benign) cause of abdominal pain in pregnancy. Your caregiver still must evaluate you.  You develop a bad headache that does not go away.  You develop fever, diarrhea, pain with urination, or shortness of breath.  You develop visual problems, blurry, or double vision.  You fall or are in a car accident or any kind of trauma.  There is mental or physical violence at home. Document Released: 04/11/2001 Document Revised: 07/10/2011 Document Reviewed: 10/14/2008 [facility] Patient Information 2013 [place], [place].

## 2012-09-25 ENCOUNTER — Ambulatory Visit (INDEPENDENT_AMBULATORY_CARE_PROVIDER_SITE_OTHER): Payer: Managed Care, Other (non HMO) | Admitting: Sports Medicine

## 2012-09-25 VITALS — BP 107/69 | Wt 144.0 lb

## 2012-09-25 DIAGNOSIS — Z3490 Encounter for supervision of normal pregnancy, unspecified, unspecified trimester: Secondary | ICD-10-CM

## 2012-09-25 DIAGNOSIS — I872 Venous insufficiency (chronic) (peripheral): Secondary | ICD-10-CM

## 2012-09-25 DIAGNOSIS — Z331 Pregnant state, incidental: Secondary | ICD-10-CM

## 2012-09-25 MED ORDER — MEDICAL COMPRESSION SOCKS MISC: 1.0000 [IU] | Freq: Every day | Status: DC

## 2012-09-25 NOTE — Patient Instructions (Addendum)
We are checking labs today for the quad screen. We will have you get an ultrasound in 3 weeks for the anatomy scan.  I have given you a prescription for compression socks.  You can go to either [facility] on [address] or other medical supply provider to obtain these.. . Please follow up in 3 weeks after the ultrasound.

## 2012-09-25 NOTE — Progress Notes (Signed)
Pt is a 27 y.o. [id number] [redacted]w[redacted]d who presents today for routine prenatal care.  She reports doing much better since the last time I saw her.  She reports her mood is significantly improved.  She is increase her activity.  She does continue to work.  She has had occasional swelling when on her feet for a long time and started wearing over-the-counter compression socks she had prior to pregnancy.  She has no pitting edema, and denies any changes in her vision or headaches.   Obtain QUAD screen today.  20 week ultrasound ordered and scheduled. Given Rx for prescription strength compression socks to be worn while at work. Patient has stopped smoking and remained smoke-free, has stopped drinking soda.   Uterine fundus is 2 fingerbreadths below the umbilicus Return in 3 weeks for 20 week appointment.

## 2012-10-07 NOTE — Progress Notes (Signed)
Chart reviewed, I agree with Dr Janeece Riggers care plan as documented.  JB

## 2012-10-17 ENCOUNTER — Ambulatory Visit (HOSPITAL_COMMUNITY)
Admission: RE | Admit: 2012-10-17 | Discharge: 2012-10-17 | Disposition: A | Discharge status: Home / Self Care | Payer: Managed Care, Other (non HMO) | Source: Ambulatory Visit | Attending: Family Medicine | Admitting: Family Medicine

## 2012-10-17 DIAGNOSIS — Z3490 Encounter for supervision of normal pregnancy, unspecified, unspecified trimester: Secondary | ICD-10-CM

## 2012-10-17 DIAGNOSIS — Z363 Encounter for antenatal screening for malformations: Secondary | ICD-10-CM | POA: Insufficient documentation

## 2012-10-17 DIAGNOSIS — O358XX0 Maternal care for other (suspected) fetal abnormality and damage, not applicable or unspecified: Secondary | ICD-10-CM | POA: Insufficient documentation

## 2012-10-17 DIAGNOSIS — Z1389 Encounter for screening for other disorder: Secondary | ICD-10-CM | POA: Insufficient documentation

## 2012-10-23 ENCOUNTER — Ambulatory Visit (INDEPENDENT_AMBULATORY_CARE_PROVIDER_SITE_OTHER): Payer: Managed Care, Other (non HMO) | Admitting: Sports Medicine

## 2012-10-23 VITALS — BP 100/68 | Wt 148.0 lb

## 2012-10-23 DIAGNOSIS — Z3490 Encounter for supervision of normal pregnancy, unspecified, unspecified trimester: Secondary | ICD-10-CM

## 2012-10-23 DIAGNOSIS — O99340 Other mental disorders complicating pregnancy, unspecified trimester: Secondary | ICD-10-CM

## 2012-10-23 DIAGNOSIS — O99342 Other mental disorders complicating pregnancy, second trimester: Secondary | ICD-10-CM

## 2012-10-23 DIAGNOSIS — Z331 Pregnant state, incidental: Secondary | ICD-10-CM

## 2012-10-23 DIAGNOSIS — K089 Disorder of teeth and supporting structures, unspecified: Secondary | ICD-10-CM

## 2012-10-23 NOTE — Patient Instructions (Addendum)
Please follow up with Dr. Mauricio Po in [facility] clinic in 4 weeks.   Enjoy your SUMMER!  Pregnancy - Second Trimester The second trimester of pregnancy (3 to 6 months) is a period of rapid growth for you and your baby. At the end of the sixth month, your baby is about 9 inches long and weighs 1 1/2 pounds. You will begin to feel the baby move between 18 and 20 weeks of the pregnancy. This is called quickening. Weight gain is faster. A clear fluid (colostrum) may leak out of your breasts. You may feel small contractions of the womb (uterus). This is known as false labor or Braxton-Hicks contractions. This is like a practice for labor when the baby is ready to be born. Usually, the problems with morning sickness have usually passed by the end of your first trimester. Some women develop small dark blotches (called cholasma, mask of pregnancy) on their face that usually goes away after the baby is born. Exposure to the sun makes the blotches worse. Acne may also develop in some pregnant women and pregnant women who have acne, may find that it goes away. PRENATAL EXAMS  Blood work may continue to be done during prenatal exams. These tests are done to check on your health and the probable health of your baby. Blood work is used to follow your blood levels (hemoglobin). Anemia (low hemoglobin) is common during pregnancy. Iron and vitamins are given to help prevent this. You will also be checked for diabetes between 24 and 28 weeks of the pregnancy. Some of the previous blood tests may be repeated.  The size of the uterus is measured during each visit. This is to make sure that the baby is continuing to grow properly according to the dates of the pregnancy.  Your blood pressure is checked every prenatal visit. This is to make sure you are not getting toxemia.  Your urine is checked to make sure you do not have an infection, diabetes or protein in the urine.  Your weight is checked often to make sure gains are happening  at the suggested rate. This is to ensure that both you and your baby are growing normally.  Sometimes, an ultrasound is performed to confirm the proper growth and development of the baby. This is a test which bounces harmless sound waves off the baby so your caregiver can more accurately determine due dates. Sometimes, a test is done on the amniotic fluid surrounding the baby. This test is called an amniocentesis. The amniotic fluid is obtained by sticking a needle into the belly (abdomen). This is done to check the chromosomes in instances where there is a concern about possible genetic problems with the baby. It is also sometimes done near the end of pregnancy if an early delivery is required. In this case, it is done to help make sure the baby's lungs are mature enough for the baby to live outside of the womb. CHANGES OCCURING IN THE SECOND TRIMESTER OF PREGNANCY Your body goes through many changes during pregnancy. They vary from person to person. Talk to your caregiver about changes you notice that you are concerned about.  During the second trimester, you will likely have an increase in your appetite. It is normal to have cravings for certain foods. This varies from person to person and pregnancy to pregnancy.  Your lower abdomen will begin to bulge.  You may have to urinate more often because the uterus and baby are pressing on your bladder. It is  also common to get more bladder infections during pregnancy. You can help this by drinking lots of fluids and emptying your bladder before and after intercourse.  You may begin to get stretch marks on your hips, abdomen, and breasts. These are normal changes in the body during pregnancy. There are no exercises or medicines to take that prevent this change.  You may begin to develop swollen and bulging veins (varicose veins) in your legs. Wearing support hose, elevating your feet for 15 minutes, 3 to 4 times a day and limiting salt in your diet helps  lessen the problem.  Heartburn may develop as the uterus grows and pushes up against the stomach. Antacids recommended by your caregiver helps with this problem. Also, eating smaller meals 4 to 5 times a day helps.  Constipation can be treated with a stool softener or adding bulk to your diet. Drinking lots of fluids, and eating vegetables, fruits, and whole grains are helpful.  Exercising is also helpful. If you have been very active up until your pregnancy, most of these activities can be continued during your pregnancy. If you have been less active, it is helpful to start an exercise program such as walking.  Hemorrhoids may develop at the end of the second trimester. Warm sitz baths and hemorrhoid cream recommended by your caregiver helps hemorrhoid problems.  Backaches may develop during this time of your pregnancy. Avoid heavy lifting, wear low heal shoes, and practice good posture to help with backache problems.  Some pregnant women develop tingling and numbness of their hand and fingers because of swelling and tightening of ligaments in the wrist (carpel tunnel syndrome). This goes away after the baby is born.  As your breasts enlarge, you may have to get a bigger bra. Get a comfortable, cotton, support bra. Do not get a nursing bra until the last month of the pregnancy if you will be nursing the baby.  You may get a dark line from your belly button to the pubic area called the linea nigra.  You may develop rosy cheeks because of increase blood flow to the face.  You may develop spider looking lines of the face, neck, arms, and chest. These go away after the baby is born. HOME CARE INSTRUCTIONS   It is extremely important to avoid all smoking, herbs, alcohol, and unprescribed drugs during your pregnancy. These chemicals affect the formation and growth of the baby. Avoid these chemicals throughout the pregnancy to ensure the delivery of a healthy infant.  Most of your home care  instructions are the same as suggested for the first trimester of your pregnancy. Keep your caregiver's appointments. Follow your caregiver's instructions regarding medicine use, exercise, and diet.  During pregnancy, you are providing food for you and your baby. Continue to eat regular, well-balanced meals. Choose foods such as meat, fish, milk and other low fat dairy products, vegetables, fruits, and whole-grain breads and cereals. Your caregiver will tell you of the ideal weight gain.  A physical sexual relationship may be continued up until near the end of pregnancy if there are no other problems. Problems could include early (premature) leaking of amniotic fluid from the membranes, vaginal bleeding, abdominal pain, or other medical or pregnancy problems.  Exercise regularly if there are no restrictions. Check with your caregiver if you are unsure of the safety of some of your exercises. The greatest weight gain will occur in the last 2 trimesters of pregnancy. Exercise will help you:  Control your weight.  Get you in shape for labor and delivery.  Lose weight after you have the baby.  Wear a good support or jogging bra for breast tenderness during pregnancy. This may help if worn during sleep. Pads or tissues may be used in the bra if you are leaking colostrum.  Do not use hot tubs, steam rooms or saunas throughout the pregnancy.  Wear your seat belt at all times when driving. This protects you and your baby if you are in an accident.  Avoid raw meat, uncooked cheese, cat litter boxes, and soil used by cats. These carry germs that can cause birth defects in the baby.  The second trimester is also a good time to visit your dentist for your dental health if this has not been done yet. Getting your teeth cleaned is okay. Use a soft toothbrush. Brush gently during pregnancy.  It is easier to leak urine during pregnancy. Tightening up and strengthening the pelvic muscles will help with this  problem. Practice stopping your urination while you are going to the bathroom. These are the same muscles you need to strengthen. It is also the muscles you would use as if you were trying to stop from passing gas. You can practice tightening these muscles up 10 times a set and repeating this about 3 times per day. Once you know what muscles to tighten up, do not perform these exercises during urination. It is more likely to contribute to an infection by backing up the urine.  Ask for help if you have financial, counseling, or nutritional needs during pregnancy. Your caregiver will be able to offer counseling for these needs as well as refer you for other special needs.  Your skin may become oily. If so, wash your face with mild soap, use non-greasy moisturizer and oil or cream based makeup. MEDICINES AND DRUG USE IN PREGNANCY  Take prenatal vitamins as directed. The vitamin should contain 1 milligram of folic acid. Keep all vitamins out of reach of children. Only a couple vitamins or tablets containing iron may be fatal to a baby or young child when ingested.  Avoid use of all medicines, including herbs, over-the-counter medicines, not prescribed or suggested by your caregiver. Only take over-the-counter or prescription medicines for pain, discomfort, or fever as directed by your caregiver. Do not use aspirin.  Let your caregiver also know about herbs you may be using.  Alcohol is related to a number of birth defects. This includes fetal alcohol syndrome. All alcohol, in any form, should be avoided completely. Smoking will cause low birth rate and premature babies.  Street or illegal drugs are very harmful to the baby. They are absolutely forbidden. A baby born to an addicted mother will be addicted at birth. The baby will go through the same withdrawal an adult does. SEEK MEDICAL CARE IF:  You have any concerns or worries during your pregnancy. It is better to call with your questions if you feel  they cannot wait, rather than worry about them. SEEK IMMEDIATE MEDICAL CARE IF:   An unexplained oral temperature above 102 F (38.9 C) develops, or as your caregiver suggests.  You have leaking of fluid from the vagina (birth canal). If leaking membranes are suspected, take your temperature and tell your caregiver of this when you call.  There is vaginal spotting, bleeding, or passing clots. Tell your caregiver of the amount and how many pads are used. Light spotting in pregnancy is common, especially following intercourse.  You develop a bad  smelling vaginal discharge with a change in the color from clear to white.  You continue to feel sick to your stomach (nauseated) and have no relief from remedies suggested. You vomit blood or coffee ground-like materials.  You lose more than 2 pounds of weight or gain more than 2 pounds of weight over 1 week, or as suggested by your caregiver.  You notice swelling of your face, hands, feet, or legs.  You get exposed to [place] measles and have never had them.  You are exposed to fifth disease or chickenpox.  You develop belly (abdominal) pain. Round ligament discomfort is a common non-cancerous (benign) cause of abdominal pain in pregnancy. Your caregiver still must evaluate you.  You develop a bad headache that does not go away.  You develop fever, diarrhea, pain with urination, or shortness of breath.  You develop visual problems, blurry, or double vision.  You fall or are in a car accident or any kind of trauma.  There is mental or physical violence at home. Document Released: 04/11/2001 Document Revised: 01/10/2012 Document Reviewed: 10/14/2008 [facility] Patient Information 2014 [place], [place].

## 2012-10-23 NOTE — Progress Notes (Signed)
Pt is a 27 y.o. [id number] [redacted]w[redacted]d by LMP who presents today for routine prenatal care.  She reports doing well.  She reports that her kids up and out of the beach for the past 2 weeks with her family.  She has been join a break but has unfortunately started smoking again.  She then smoking 2-3 cigarette per day.  We discussed smoking cessation and 1 800 quit now.  Reports remove this significantly improved.  Has been in touch with the [facility] in does have resources available for history.  Has not been able to follow up with dentistry yet.  Continues her PNV PREG overview updated F/u in 4 weeks with OB clinic. > f/u smoking

## 2012-11-21 ENCOUNTER — Ambulatory Visit (INDEPENDENT_AMBULATORY_CARE_PROVIDER_SITE_OTHER): Payer: Managed Care, Other (non HMO) | Admitting: Family Medicine

## 2012-11-21 VITALS — BP 116/64 | Temp 99.2°F | Wt 148.0 lb

## 2012-11-21 DIAGNOSIS — Z3492 Encounter for supervision of normal pregnancy, unspecified, second trimester: Secondary | ICD-10-CM

## 2012-11-21 DIAGNOSIS — Z3480 Encounter for supervision of other normal pregnancy, unspecified trimester: Secondary | ICD-10-CM

## 2012-11-21 NOTE — Progress Notes (Signed)
FMTS Attending Admission Note: Melissa Eniola,MD I  have seen and examined this patient, reviewed their chart. I have discussed this patient with the resident. I agree with the resident's findings, assessment and care plan. I supervised resident and medical student during physical exam,bedside U/S performed with regular fetal heart movement.  Continue prenatal vitamin,call if any concern, I agree with follow up in 3wks for 1hr Glucola testing.  Patient here for OB visit,denies any concern with her pregnancy,normal fetal movement,no vaginal discharge

## 2012-11-21 NOTE — Progress Notes (Deleted)
Melissa Shelton is a 27 y.o. [id number] at [redacted]w[redacted]d for routine follow up.  She reports no problems.   See flow sheet for details.  A/P: Pregnancy at [redacted]w[redacted]d.  Doing well.   Pregnancy issues include smoking tobacco, trying to9 quit now down to 5-6 cigg/day.  Childbirth and education classes were offered. Preterm labor precautions reviewed. Follow up 3 weeks for 28 week visit for labs and 1 hr glucola.  FMTS Attending Admission Note: Melissa Eniola,MD I  have seen and examined this patient, reviewed their chart. I have discussed this patient with the resident. I agree with the resident's findings, assessment and care plan.  27 y/o F [id number] at 25 week 2 days,here for OB visit, denies any concern,fetus moving adequately, no vaginal discharge or pain. Resident and medical student supervised during examination,I agree with documentation,bedside U/S performed,fetal heart movement observed. Anatomic scan and PAP result reviewed, all looked good. She has had depression screening done twice, not reviewed today, due to high risk for depression,recommend reassessment with her PCP. Ideally should follow up in 3 wks,but for 1 hr Glucola testing and HIV,CBC,RPR testing, I agree with 3 wk follow up with her PCP to get lab done.

## 2012-11-21 NOTE — Progress Notes (Signed)
Melissa Shelton is a 27 y.o. [id number] at [redacted]w[redacted]d for routine follow up.  She reports no problems.  See flow sheet for details.  A/P: Pregnancy at [redacted]w[redacted]d.  Doing well.   Pregnancy issues include smoking, trying to quit, now down to 5-6 cigarettes daily.  Childbirth and education classes were offered. Preterm labor precautions reviewed. Follow up 2-3 weeks for 28 week visit for HIV, RPR, and 1 hr GTT.

## 2012-11-21 NOTE — Patient Instructions (Addendum)
Come back to see Dr. Berline Chough in 2-3 weeks for your 28 week visit.     Fetal Movement Counts Patient Name: __________________________________________________ Patient Due Date: ____________________ Performing a fetal movement count is highly recommended in high-risk pregnancies, but it is good for every pregnant woman to do. Your caregiver may ask you to start counting fetal movements at 28 weeks of the pregnancy. Fetal movements often increase:  After eating a full meal.  After physical activity.  After eating or drinking something sweet or cold.  At rest. Pay attention to when you feel the baby is most active. This will help you notice a pattern of your baby's sleep and wake cycles and what factors contribute to an increase in fetal movement. It is important to perform a fetal movement count at the same time each day when your baby is normally most active.  HOW TO COUNT FETAL MOVEMENTS 1. Find a quiet and comfortable area to sit or lie down on your left side. Lying on your left side provides the best blood and oxygen circulation to your baby. 2. Write down the day and time on a sheet of paper or in a journal. 3. Start counting kicks, flutters, swishes, rolls, or jabs in a 2 hour period. You should feel at least 10 movements within 2 hours. 4. If you do not feel 10 movements in 2 hours, wait 2 3 hours and count again. Look for a change in the pattern or not enough counts in 2 hours. SEEK MEDICAL CARE IF:  You feel less than 10 counts in 2 hours, tried twice.  There is no movement in over an hour.  The pattern is changing or taking longer each day to reach 10 counts in 2 hours.  You feel the baby is not moving as he or she usually does. Date: ____________ Movements: ____________ Start time: ____________ Doreatha Martin time: ____________  Date: ____________ Movements: ____________ Start time: ____________ Doreatha Martin time: ____________ Date: ____________ Movements: ____________ Start time: ____________  Doreatha Martin time: ____________ Date: ____________ Movements: ____________ Start time: ____________ Doreatha Martin time: ____________ Date: ____________ Movements: ____________ Start time: ____________ Doreatha Martin time: ____________ Date: ____________ Movements: ____________ Start time: ____________ Doreatha Martin time: ____________ Date: ____________ Movements: ____________ Start time: ____________ Doreatha Martin time: ____________ Date: ____________ Movements: ____________ Start time: ____________ Doreatha Martin time: ____________  Date: ____________ Movements: ____________ Start time: ____________ Doreatha Martin time: ____________ Date: ____________ Movements: ____________ Start time: ____________ Doreatha Martin time: ____________ Date: ____________ Movements: ____________ Start time: ____________ Doreatha Martin time: ____________ Date: ____________ Movements: ____________ Start time: ____________ Doreatha Martin time: ____________ Date: ____________ Movements: ____________ Start time: ____________ Doreatha Martin time: ____________ Date: ____________ Movements: ____________ Start time: ____________ Doreatha Martin time: ____________ Date: ____________ Movements: ____________ Start time: ____________ Doreatha Martin time: ____________  Date: ____________ Movements: ____________ Start time: ____________ Doreatha Martin time: ____________ Date: ____________ Movements: ____________ Start time: ____________ Doreatha Martin time: ____________ Date: ____________ Movements: ____________ Start time: ____________ Doreatha Martin time: ____________ Date: ____________ Movements: ____________ Start time: ____________ Doreatha Martin time: ____________ Date: ____________ Movements: ____________ Start time: ____________ Doreatha Martin time: ____________ Date: ____________ Movements: ____________ Start time: ____________ Doreatha Martin time: ____________ Date: ____________ Movements: ____________ Start time: ____________ Doreatha Martin time: ____________  Date: ____________ Movements: ____________ Start time: ____________ Doreatha Martin time: ____________ Date: ____________  Movements: ____________ Start time: ____________ Doreatha Martin time: ____________ Date: ____________ Movements: ____________ Start time: ____________ Doreatha Martin time: ____________ Date: ____________ Movements: ____________ Start time: ____________ Doreatha Martin time: ____________ Date: ____________ Movements: ____________ Start time: ____________ Doreatha Martin time: ____________ Date: ____________ Movements: ____________ Start time: ____________ Doreatha Martin time: ____________ Date:  ____________ Movements: ____________ Start time: ____________ Doreatha Martin time: ____________  Date: ____________ Movements: ____________ Start time: ____________ Doreatha Martin time: ____________ Date: ____________ Movements: ____________ Start time: ____________ Doreatha Martin time: ____________ Date: ____________ Movements: ____________ Start time: ____________ Doreatha Martin time: ____________ Date: ____________ Movements: ____________ Start time: ____________ Doreatha Martin time: ____________ Date: ____________ Movements: ____________ Start time: ____________ Doreatha Martin time: ____________ Date: ____________ Movements: ____________ Start time: ____________ Doreatha Martin time: ____________ Date: ____________ Movements: ____________ Start time: ____________ Doreatha Martin time: ____________  Date: ____________ Movements: ____________ Start time: ____________ Doreatha Martin time: ____________ Date: ____________ Movements: ____________ Start time: ____________ Doreatha Martin time: ____________ Date: ____________ Movements: ____________ Start time: ____________ Doreatha Martin time: ____________ Date: ____________ Movements: ____________ Start time: ____________ Doreatha Martin time: ____________ Date: ____________ Movements: ____________ Start time: ____________ Doreatha Martin time: ____________ Date: ____________ Movements: ____________ Start time: ____________ Doreatha Martin time: ____________ Date: ____________ Movements: ____________ Start time: ____________ Doreatha Martin time: ____________  Date: ____________ Movements: ____________ Start time:  ____________ Doreatha Martin time: ____________ Date: ____________ Movements: ____________ Start time: ____________ Doreatha Martin time: ____________ Date: ____________ Movements: ____________ Start time: ____________ Doreatha Martin time: ____________ Date: ____________ Movements: ____________ Start time: ____________ Doreatha Martin time: ____________ Date: ____________ Movements: ____________ Start time: ____________ Doreatha Martin time: ____________ Date: ____________ Movements: ____________ Start time: ____________ Doreatha Martin time: ____________ Date: ____________ Movements: ____________ Start time: ____________ Doreatha Martin time: ____________  Date: ____________ Movements: ____________ Start time: ____________ Doreatha Martin time: ____________ Date: ____________ Movements: ____________ Start time: ____________ Doreatha Martin time: ____________ Date: ____________ Movements: ____________ Start time: ____________ Doreatha Martin time: ____________ Date: ____________ Movements: ____________ Start time: ____________ Doreatha Martin time: ____________ Date: ____________ Movements: ____________ Start time: ____________ Doreatha Martin time: ____________ Date: ____________ Movements: ____________ Start time: ____________ Doreatha Martin time: ____________ Document Released: 05/17/2006 Document Revised: 04/03/2012 Document Reviewed: 02/12/2012 [employer] Patient Information 2014 [employer]

## 2012-11-21 NOTE — Progress Notes (Deleted)
NA

## 2012-12-06 ENCOUNTER — Encounter: Payer: Managed Care, Other (non HMO) | Admitting: Sports Medicine

## 2012-12-11 ENCOUNTER — Ambulatory Visit (INDEPENDENT_AMBULATORY_CARE_PROVIDER_SITE_OTHER): Payer: Managed Care, Other (non HMO) | Admitting: Sports Medicine

## 2012-12-11 VITALS — BP 111/59 | Temp 99.1°F | Wt 150.0 lb

## 2012-12-11 DIAGNOSIS — F329 Major depressive disorder, single episode, unspecified: Secondary | ICD-10-CM

## 2012-12-11 DIAGNOSIS — Z3490 Encounter for supervision of normal pregnancy, unspecified, unspecified trimester: Secondary | ICD-10-CM

## 2012-12-11 DIAGNOSIS — O99340 Other mental disorders complicating pregnancy, unspecified trimester: Secondary | ICD-10-CM

## 2012-12-11 DIAGNOSIS — K089 Disorder of teeth and supporting structures, unspecified: Secondary | ICD-10-CM

## 2012-12-11 DIAGNOSIS — Z331 Pregnant state, incidental: Secondary | ICD-10-CM

## 2012-12-11 NOTE — Patient Instructions (Addendum)
Please follow up with Dr. Mauricio Po or Lum Babe @ 32 weeks for your next appointment.  After that you will be seeing me again.  Pregnancy - Second Trimester The second trimester of pregnancy (3 to 6 months) is a period of rapid growth for you and your baby. At the end of the sixth month, your baby is about 9 inches long and weighs 1 1/2 pounds. You will begin to feel the baby move between 18 and 20 weeks of the pregnancy. This is called quickening. Weight gain is faster. A clear fluid (colostrum) may leak out of your breasts. You may feel small contractions of the womb (uterus). This is known as false labor or Braxton-Hicks contractions. This is like a practice for labor when the baby is ready to be born. Usually, the problems with morning sickness have usually passed by the end of your first trimester. Some women develop small dark blotches (called cholasma, mask of pregnancy) on their face that usually goes away after the baby is born. Exposure to the sun makes the blotches worse. Acne may also develop in some pregnant women and pregnant women who have acne, may find that it goes away. PRENATAL EXAMS  Blood work may continue to be done during prenatal exams. These tests are done to check on your health and the probable health of your baby. Blood work is used to follow your blood levels (hemoglobin). Anemia (low hemoglobin) is common during pregnancy. Iron and vitamins are given to help prevent this. You will also be checked for diabetes between 24 and 28 weeks of the pregnancy. Some of the previous blood tests may be repeated.  The size of the uterus is measured during each visit. This is to make sure that the baby is continuing to grow properly according to the dates of the pregnancy.  Your blood pressure is checked every prenatal visit. This is to make sure you are not getting toxemia.  Your urine is checked to make sure you do not have an infection, diabetes or protein in the urine.  Your weight is  checked often to make sure gains are happening at the suggested rate. This is to ensure that both you and your baby are growing normally.  Sometimes, an ultrasound is performed to confirm the proper growth and development of the baby. This is a test which bounces harmless sound waves off the baby so your caregiver can more accurately determine due dates. Sometimes, a test is done on the amniotic fluid surrounding the baby. This test is called an amniocentesis. The amniotic fluid is obtained by sticking a needle into the belly (abdomen). This is done to check the chromosomes in instances where there is a concern about possible genetic problems with the baby. It is also sometimes done near the end of pregnancy if an early delivery is required. In this case, it is done to help make sure the baby's lungs are mature enough for the baby to live outside of the womb. CHANGES OCCURING IN THE SECOND TRIMESTER OF PREGNANCY Your body goes through many changes during pregnancy. They vary from person to person. Talk to your caregiver about changes you notice that you are concerned about.  During the second trimester, you will likely have an increase in your appetite. It is normal to have cravings for certain foods. This varies from person to person and pregnancy to pregnancy.  Your lower abdomen will begin to bulge.  You may have to urinate more often because the uterus and baby  are pressing on your bladder. It is also common to get more bladder infections during pregnancy. You can help this by drinking lots of fluids and emptying your bladder before and after intercourse.  You may begin to get stretch marks on your hips, abdomen, and breasts. These are normal changes in the body during pregnancy. There are no exercises or medicines to take that prevent this change.  You may begin to develop swollen and bulging veins (varicose veins) in your legs. Wearing support hose, elevating your feet for 15 minutes, 3 to 4  times a day and limiting salt in your diet helps lessen the problem.  Heartburn may develop as the uterus grows and pushes up against the stomach. Antacids recommended by your caregiver helps with this problem. Also, eating smaller meals 4 to 5 times a day helps.  Constipation can be treated with a stool softener or adding bulk to your diet. Drinking lots of fluids, and eating vegetables, fruits, and whole grains are helpful.  Exercising is also helpful. If you have been very active up until your pregnancy, most of these activities can be continued during your pregnancy. If you have been less active, it is helpful to start an exercise program such as walking.  Hemorrhoids may develop at the end of the second trimester. Warm sitz baths and hemorrhoid cream recommended by your caregiver helps hemorrhoid problems.  Backaches may develop during this time of your pregnancy. Avoid heavy lifting, wear low heal shoes, and practice good posture to help with backache problems.  Some pregnant women develop tingling and numbness of their hand and fingers because of swelling and tightening of ligaments in the wrist (carpel tunnel syndrome). This goes away after the baby is born.  As your breasts enlarge, you may have to get a bigger bra. Get a comfortable, cotton, support bra. Do not get a nursing bra until the last month of the pregnancy if you will be nursing the baby.  You may get a dark line from your belly button to the pubic area called the linea nigra.  You may develop rosy cheeks because of increase blood flow to the face.  You may develop spider looking lines of the face, neck, arms, and chest. These go away after the baby is born. HOME CARE INSTRUCTIONS   It is extremely important to avoid all smoking, herbs, alcohol, and unprescribed drugs during your pregnancy. These chemicals affect the formation and growth of the baby. Avoid these chemicals throughout the pregnancy to ensure the delivery of a  healthy infant.  Most of your home care instructions are the same as suggested for the first trimester of your pregnancy. Keep your caregiver's appointments. Follow your caregiver's instructions regarding medicine use, exercise, and diet.  During pregnancy, you are providing food for you and your baby. Continue to eat regular, well-balanced meals. Choose foods such as meat, fish, milk and other low fat dairy products, vegetables, fruits, and whole-grain breads and cereals. Your caregiver will tell you of the ideal weight gain.  A physical sexual relationship may be continued up until near the end of pregnancy if there are no other problems. Problems could include early (premature) leaking of amniotic fluid from the membranes, vaginal bleeding, abdominal pain, or other medical or pregnancy problems.  Exercise regularly if there are no restrictions. Check with your caregiver if you are unsure of the safety of some of your exercises. The greatest weight gain will occur in the last 2 trimesters of pregnancy. Exercise will  help you:  Control your weight.  Get you in shape for labor and delivery.  Lose weight after you have the baby.  Wear a good support or jogging bra for breast tenderness during pregnancy. This may help if worn during sleep. Pads or tissues may be used in the bra if you are leaking colostrum.  Do not use hot tubs, steam rooms or saunas throughout the pregnancy.  Wear your seat belt at all times when driving. This protects you and your baby if you are in an accident.  Avoid raw meat, uncooked cheese, cat litter boxes, and soil used by cats. These carry germs that can cause birth defects in the baby.  The second trimester is also a good time to visit your dentist for your dental health if this has not been done yet. Getting your teeth cleaned is okay. Use a soft toothbrush. Brush gently during pregnancy.  It is easier to leak urine during pregnancy. Tightening up and  strengthening the pelvic muscles will help with this problem. Practice stopping your urination while you are going to the bathroom. These are the same muscles you need to strengthen. It is also the muscles you would use as if you were trying to stop from passing gas. You can practice tightening these muscles up 10 times a set and repeating this about 3 times per day. Once you know what muscles to tighten up, do not perform these exercises during urination. It is more likely to contribute to an infection by backing up the urine.  Ask for help if you have financial, counseling, or nutritional needs during pregnancy. Your caregiver will be able to offer counseling for these needs as well as refer you for other special needs.  Your skin may become oily. If so, wash your face with mild soap, use non-greasy moisturizer and oil or cream based makeup. MEDICINES AND DRUG USE IN PREGNANCY  Take prenatal vitamins as directed. The vitamin should contain 1 milligram of folic acid. Keep all vitamins out of reach of children. Only a couple vitamins or tablets containing iron may be fatal to a baby or young child when ingested.  Avoid use of all medicines, including herbs, over-the-counter medicines, not prescribed or suggested by your caregiver. Only take over-the-counter or prescription medicines for pain, discomfort, or fever as directed by your caregiver. Do not use aspirin.  Let your caregiver also know about herbs you may be using.  Alcohol is related to a number of birth defects. This includes fetal alcohol syndrome. All alcohol, in any form, should be avoided completely. Smoking will cause low birth rate and premature babies.  Street or illegal drugs are very harmful to the baby. They are absolutely forbidden. A baby born to an addicted mother will be addicted at birth. The baby will go through the same withdrawal an adult does. SEEK MEDICAL CARE IF:  You have any concerns or worries during your pregnancy.  It is better to call with your questions if you feel they cannot wait, rather than worry about them. SEEK IMMEDIATE MEDICAL CARE IF:   An unexplained oral temperature above 102 F (38.9 C) develops, or as your caregiver suggests.  You have leaking of fluid from the vagina (birth canal). If leaking membranes are suspected, take your temperature and tell your caregiver of this when you call.  There is vaginal spotting, bleeding, or passing clots. Tell your caregiver of the amount and how many pads are used. Light spotting in pregnancy is common, especially  following intercourse.  You develop a bad smelling vaginal discharge with a change in the color from clear to white.  You continue to feel sick to your stomach (nauseated) and have no relief from remedies suggested. You vomit blood or coffee ground-like materials.  You lose more than 2 pounds of weight or gain more than 2 pounds of weight over 1 week, or as suggested by your caregiver.  You notice swelling of your face, hands, feet, or legs.  You get exposed to [place] measles and have never had them.  You are exposed to fifth disease or chickenpox.  You develop belly (abdominal) pain. Round ligament discomfort is a common non-cancerous (benign) cause of abdominal pain in pregnancy. Your caregiver still must evaluate you.  You develop a bad headache that does not go away.  You develop fever, diarrhea, pain with urination, or shortness of breath.  You develop visual problems, blurry, or double vision.  You fall or are in a car accident or any kind of trauma.  There is mental or physical violence at home. Document Released: 04/11/2001 Document Revised: 01/10/2012 Document Reviewed: 10/14/2008 [facility] Patient Information 2014 [place], [place].

## 2012-12-11 NOTE — Progress Notes (Signed)
Pt is a 27 y.o. [id number] [redacted]w[redacted]d by LMP who presents today for routine care.   Glucola today = 110 Back pain.  Anterior innominant on R; L5 rotated Left - OB roll performed today and long lever HVLA Stopped smoking X 3 days - encouraged to stay quit. [facility] Pregnancy Home Risk form completed today: PHQ-9 - Difficulty: not difficult (discussed with pt) 1 2 3 4 5 6 7 8 9  Total:  1 1 3 2 1  0 0 0 0 8   Return to care in 4 weeks for a 32 week check with OB clinic. Continue prenatal vitamin

## 2012-12-12 NOTE — Assessment & Plan Note (Signed)
Patient has an appointment for early next month with the dentist.

## 2012-12-12 NOTE — Assessment & Plan Note (Signed)
Improved today.  [facility] pregnancy home still in contact with patient.

## 2012-12-25 ENCOUNTER — Encounter (HOSPITAL_COMMUNITY): Payer: Self-pay | Admitting: *Deleted

## 2012-12-25 ENCOUNTER — Inpatient Hospital Stay (HOSPITAL_COMMUNITY)
Admission: AD | Admit: 2012-12-25 | Discharge: 2012-12-25 | Disposition: A | Discharge status: Home / Self Care | Payer: Managed Care, Other (non HMO) | Source: Ambulatory Visit | Attending: Obstetrics & Gynecology | Admitting: Obstetrics & Gynecology

## 2012-12-25 DIAGNOSIS — B373 Candidiasis of vulva and vagina: Secondary | ICD-10-CM | POA: Insufficient documentation

## 2012-12-25 DIAGNOSIS — K089 Disorder of teeth and supporting structures, unspecified: Secondary | ICD-10-CM

## 2012-12-25 DIAGNOSIS — B3731 Acute candidiasis of vulva and vagina: Secondary | ICD-10-CM | POA: Insufficient documentation

## 2012-12-25 DIAGNOSIS — R109 Unspecified abdominal pain: Secondary | ICD-10-CM | POA: Insufficient documentation

## 2012-12-25 DIAGNOSIS — O2390 Unspecified genitourinary tract infection in pregnancy, unspecified trimester: Secondary | ICD-10-CM

## 2012-12-25 DIAGNOSIS — I872 Venous insufficiency (chronic) (peripheral): Secondary | ICD-10-CM

## 2012-12-25 DIAGNOSIS — F329 Major depressive disorder, single episode, unspecified: Secondary | ICD-10-CM

## 2012-12-25 LAB — URINALYSIS, ROUTINE W REFLEX MICROSCOPIC
Glucose, UA: NEGATIVE mg/dL
Hgb urine dipstick: NEGATIVE
Specific Gravity, Urine: 1.01 (ref 1.005–1.030)
pH: 7.5 (ref 5.0–8.0)

## 2012-12-25 LAB — WET PREP, GENITAL
Trich, Wet Prep: NONE SEEN
Yeast Wet Prep HPF POC: NONE SEEN

## 2012-12-25 MED ORDER — GI COCKTAIL ~~LOC~~
30.0000 mL | Freq: Once | ORAL | Status: AC
  Administered 2012-12-25: 30 mL via ORAL
  Filled 2012-12-25: qty 30

## 2012-12-25 MED ORDER — FLUCONAZOLE 150 MG PO TABS
150.0000 mg | ORAL_TABLET | Freq: Once | ORAL | Status: AC
  Administered 2012-12-25: 150 mg via ORAL
  Filled 2012-12-25: qty 1

## 2012-12-25 NOTE — MAU Provider Note (Signed)
History     CSN: [id number]  Arrival date and time: 12/25/12 9147   First Provider Initiated Contact with Patient 12/25/12 0059      Chief Complaint  Patient presents with  . Abdominal Pain   Abdominal Pain    Melissa Shelton is a 27 y.o. [id number] at [redacted]w[redacted]d who presents today with abdominal pain. She states that the pain started around 2000. She states that initially the pain was constant, but now it seems to come and go. She denies any fever, nausea, vomiting, constipation, diarrhea or urinary sx. She does report vaginal discharge with itching, and states that she started using OTC cream for the external itching. She denies any LOF or CTX and confirms fetal movement.   Past Medical History  Diagnosis Date  . RHINITIS, ALLERGIC 06/28/2006  . TOBACCO USER 05/03/2009  . MENSTRUATION, PAINFUL 06/28/2006  . 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 CERVICAL DYSPLASIA 06/28/2006  . ALLERGIC CONJUNCTIVITIS 06/28/2006    Past Surgical History  Procedure Laterality Date  . Colposcopy      X2, biopsy only, no ablation    Family History  Problem Relation Age of Onset  . Cervical cancer      Prevelant accross maternal side of family  . High blood pressure      on maternal side  . Diabetes    . Autoimmune disease      History  Substance Use Topics  . Smoking status: Former Smoker -- 1.00 packs/day for 8 years    Types: Cigarettes    Quit date: 07/10/2012  . Smokeless tobacco: Not on file     Comment: Quit cold [place] when found out she was pregnant  . Alcohol Use: No    Allergies: No Known Allergies  Prescriptions prior to admission  Medication Sig Dispense Refill  . Elastic Bandages & Supports (MEDICAL COMPRESSION SOCKS) MISC 1 Units by Does not apply route daily.  1 each  1  . fluconazole (DIFLUCAN) 150 MG tablet Take 1 tablet (150 mg total) by mouth once. Repeat in 3 days  2 tablet  0  . Prenatal Vit-Fe Fumarate-FA (PRENATAL MULTIVITAMIN) TABS Take 1 tablet by mouth daily  at 12 noon.        Review of Systems  Gastrointestinal: Positive for abdominal pain.   Physical Exam   Blood pressure 113/61, pulse 83, temperature 97.8 F (36.6 C), temperature source Oral, resp. rate 20, height 5\' 5"  (1.651 m), weight 69.57 kg (153 lb 6 oz), last menstrual period 05/28/2012.  Physical Exam  Nursing note and vitals reviewed. Constitutional: She is oriented to person, place, and time. She appears well-developed and well-nourished. No distress.  Cardiovascular: Normal rate.   Respiratory: Effort normal.  GI: Soft. There is no tenderness.  Genitourinary:  External: no lesion Vagina: small amount of white discharge Cervix: pink, smooth, closed/thick/high Uterus:AGA   Neurological: She is alert and oriented to person, place, and time.  Skin: Skin is warm.  Psychiatric: She has a normal mood and affect.   FHT 140, moderate with 15x15 accels, no decels Toco: no UCs  MAU Course  Procedures  Results for orders placed during the hospital encounter of 12/25/12 (from the past 24 hour(s))  URINALYSIS, ROUTINE W REFLEX MICROSCOPIC     Status: None   Collection Time    12/25/12 12:27 AM      Result Value Range   Color, Urine YELLOW  YELLOW   APPearance CLEAR  CLEAR   Specific Gravity, Urine 1.010  1.005 - 1.030   pH 7.5  5.0 - 8.0   Glucose, UA NEGATIVE  NEGATIVE mg/dL   Hgb urine dipstick NEGATIVE  NEGATIVE   Bilirubin Urine NEGATIVE  NEGATIVE   Ketones, ur NEGATIVE  NEGATIVE mg/dL   Protein, ur NEGATIVE  NEGATIVE mg/dL   Urobilinogen, UA 0.2  0.0 - 1.0 mg/dL   Nitrite NEGATIVE  NEGATIVE   Leukocytes, UA NEGATIVE  NEGATIVE  WET PREP, GENITAL     Status: Abnormal   Collection Time    12/25/12  1:00 AM      Result Value Range   Yeast Wet Prep HPF POC NONE SEEN  NONE SEEN   Trich, Wet Prep NONE SEEN  NONE SEEN   Clue Cells Wet Prep HPF POC NONE SEEN  NONE SEEN   WBC, Wet Prep HPF POC FEW (*) NONE SEEN     Assessment and Plan  Yeast infection Treated  with diflucan here in MAU 3rd trimester danger signs reviewed PTL precautions given FU with [facility] as scheduled  Tawnya Crook 12/25/2012, 1:04 AM

## 2012-12-25 NOTE — MAU Note (Signed)
PT SAYS SHE HAS BEEN HURTING SINCE 8PM-IN LOWER ABD.  SHE THINKS  SHE HAS YEAST INFECTION- HAS BEEN ITCHING X1 WEEK-  SHE BOUGHT MED  BUT DID NOT  USE IT.    SEEN   AT   [facility]  ON 8-13.   NEXT APPOINTMENT  9-11.  LAST SEX-   NONE IN [place].

## 2012-12-25 NOTE — MAU Note (Signed)
Patient states having abdominal pain that is intermittent, but has been on going since 8 pm. Also states she has a yeast infection and started using topical cream 8/26.

## 2013-01-01 NOTE — MAU Provider Note (Signed)
Attestation of Attending Supervision of Advanced Practitioner (CNM/NP): Evaluation and management procedures were performed by the Advanced Practitioner under my supervision and collaboration. I have reviewed the Advanced Practitioner's note and chart, and I agree with the management and plan.  Amneet Cendejas H. 11:27 AM   

## 2013-01-09 ENCOUNTER — Ambulatory Visit (INDEPENDENT_AMBULATORY_CARE_PROVIDER_SITE_OTHER): Payer: Managed Care, Other (non HMO) | Admitting: Sports Medicine

## 2013-01-09 VITALS — BP 110/54 | Temp 98.6°F | Wt 153.0 lb

## 2013-01-09 DIAGNOSIS — Z331 Pregnant state, incidental: Secondary | ICD-10-CM

## 2013-01-09 DIAGNOSIS — Z3490 Encounter for supervision of normal pregnancy, unspecified, unspecified trimester: Secondary | ICD-10-CM

## 2013-01-09 MED ORDER — FAMOTIDINE 40 MG PO TABS: 40.0000 mg | ORAL_TABLET | Freq: Every day | ORAL | Status: DC

## 2013-01-15 NOTE — Progress Notes (Signed)
Pt is a 27 y.o. [id number] [redacted]w[redacted]d by LMP who presents today for routine care. Pt feeling better from last visit. Still have mild swelling.  No headaches or vision change. Occasionally smoking when stressed/bored.  Taking prenatal Follow up in 2 weeks in [facility].  Potentially INTERSTED IN TUBAL NEEDS PAPER SIGNED; considering other [facility] Pregnancy overview updated

## 2013-01-23 ENCOUNTER — Ambulatory Visit (INDEPENDENT_AMBULATORY_CARE_PROVIDER_SITE_OTHER): Payer: Managed Care, Other (non HMO) | Admitting: Family Medicine

## 2013-01-23 VITALS — BP 106/69 | Temp 98.4°F | Wt 157.0 lb

## 2013-01-23 DIAGNOSIS — Z3480 Encounter for supervision of other normal pregnancy, unspecified trimester: Secondary | ICD-10-CM

## 2013-01-23 DIAGNOSIS — Z3493 Encounter for supervision of normal pregnancy, unspecified, third trimester: Secondary | ICD-10-CM

## 2013-01-23 DIAGNOSIS — Z23 Encounter for immunization: Secondary | ICD-10-CM

## 2013-01-23 LAB — CBC WITH DIFFERENTIAL/PLATELET
Basophils Relative: 0 % (ref 0–1)
Eosinophils Absolute: 0.4 10*3/uL (ref 0.0–0.7)
Eosinophils Relative: 3 % (ref 0–5)
MCH: 27.4 pg (ref 26.0–34.0)
MCHC: 34.1 g/dL (ref 30.0–36.0)
MCV: 80.4 fL (ref 78.0–100.0)
Monocytes Relative: 9 % (ref 3–12)
Neutrophils Relative %: 75 % (ref 43–77)
Platelets: 174 10*3/uL (ref 150–400)

## 2013-01-23 NOTE — Patient Instructions (Addendum)
It was nice seeing you today Melissa Shelton and I am happy that you are doing well with the baby, today we would give you Flu shot and tetanus shot, we would also be getting your 3 trimester labs which are HIV,RPR and hemoglobin. We would call you with result. Please continue to do fetal kick count, in case of contraction please call or go to the [facility] hospital. Continue your prenatal vitamin. F/U with your PCP in 2wks.  Preventing Preterm Labor Preterm labor is when a pregnant woman has contractions that cause the cervix to open, shorten, and thin before 37 weeks of pregnancy. You will have regular contractions (tightening) 2 to 3 minutes apart. This usually causes discomfort or pain. HOME CARE  Eat a healthy diet.  Take your vitamins as told by your doctor.  Drink enough fluids to keep your pee (urine) clear or pale yellow every day.  Get rest and sleep.  Do not have sex if you are at high risk for preterm labor.  Follow your doctor's advice about activity, medicines, and tests.  Avoid stress.  Avoid hard labor or exercise that lasts for a long time.  Do not smoke. GET HELP RIGHT AWAY IF:   You are having contractions.  You have belly (abdominal) pain.  You have bleeding from your vagina.  You have pain when you pee (urinate).  You have abnormal discharge from your vagina.  You have a temperature by mouth above 102 F (38.9 C). MAKE SURE YOU:  Understand these instructions.  Will watch your condition.  Will get help if you are not doing well or get worse. Document Released: 07/14/2008 Document Revised: 07/10/2011 Document Reviewed: 07/14/2008 [facility] Patient Information 2014 [place], [place].

## 2013-01-23 NOTE — Progress Notes (Signed)
27 Y/o [id number] at [redacted]w[redacted]d GA her for routine prenatal care,denies any complaints today,baby moves a lot everyday,whenever she walks a long distance she would feel pressure around her suprapubic area,her feet swells up with prolonged standing,she denies vaginal discharge or bleeding,no other concern with the pregnancy.  Exam:  Gen: Alert and oriented not in distress.  Resp: Air entry equal b/l no wheezing. Heart: S1 S2 normal no murmurs. Ext: No edema. Abd: Gravid.No tenderness. FH : on flowsheet. Fetal heart beat seen on bedside U/S. Fetus with vertex presentation on bedside U/S.  A/P: Normal prenatal care with normal pregnancy progression.        Patient doing well.        Tdap and flu shot given today.        3rd trimester lab done ( HIV,CBC,RPR).        Patient gained 4 lbs in about 2 weeks, we would keep an eye on her weight gain.        Fetal kick count discussed,preterm labor sign discussed.        She prefer Dr Berline Chough as her pediatrician.        All other labs and U/S reviewed.        F/U in 2wks for GBS.

## 2013-01-24 LAB — HIV ANTIBODY (ROUTINE TESTING W REFLEX): HIV: NONREACTIVE

## 2013-02-01 ENCOUNTER — Encounter (HOSPITAL_COMMUNITY): Payer: Self-pay | Admitting: *Deleted

## 2013-02-01 ENCOUNTER — Inpatient Hospital Stay (HOSPITAL_COMMUNITY)
Admission: AD | Admit: 2013-02-01 | Discharge: 2013-02-02 | Disposition: A | Discharge status: Home / Self Care | Payer: Managed Care, Other (non HMO) | Source: Ambulatory Visit | Attending: Obstetrics & Gynecology | Admitting: Obstetrics & Gynecology

## 2013-02-01 DIAGNOSIS — O4700 False labor before 37 completed weeks of gestation, unspecified trimester: Secondary | ICD-10-CM | POA: Insufficient documentation

## 2013-02-01 DIAGNOSIS — O4703 False labor before 37 completed weeks of gestation, third trimester: Secondary | ICD-10-CM

## 2013-02-01 DIAGNOSIS — R109 Unspecified abdominal pain: Secondary | ICD-10-CM | POA: Insufficient documentation

## 2013-02-01 LAB — URINALYSIS, ROUTINE W REFLEX MICROSCOPIC
Bilirubin Urine: NEGATIVE
Glucose, UA: NEGATIVE mg/dL
Ketones, ur: NEGATIVE mg/dL
Leukocytes, UA: NEGATIVE
Protein, ur: NEGATIVE mg/dL

## 2013-02-01 NOTE — MAU Note (Signed)
Pain in lower abd since 1930. Sharp pain that comes and stays about , then will ease off and then come back. Taken Tylenol and Benedryl to see if I could sleep it off. Baby has moved much since pain started.

## 2013-02-01 NOTE — MAU Provider Note (Signed)
History     CSN: [id number]  Arrival date and time: 02/01/13 2146   First Provider Initiated Contact with Patient 02/01/13 2223      Chief Complaint  Patient presents with  . Abdominal Pain   HPI Melissa Shelton is a 27 y.o. [phone] female @ [redacted]w[redacted]d who presents w/ report of intermittent low abdominal pains since 1900. Reports good fm, denies vb, lof, urinary frequency, hesitancy, urgency, dysuria, abnormal/malodorous vag d/c, or vulvovaginal itching/irritation.  States sometimes when she's walking her stomach will get real tight and she'll need to void.  Didn't think they were uc's, but when looking at efm since arrival, pain correlates exactly w/ uc's that are tracing. Continuity pt of Dr. Berline Chough, next appt scheduled for Fri.   OB History   Grav Para Term Preterm Abortions TAB SAB Ect Mult Living   4 2 2  1 1    2       Past Medical History  Diagnosis Date  . RHINITIS, ALLERGIC 06/28/2006  . TOBACCO USER 05/03/2009  . MENSTRUATION, PAINFUL 06/28/2006  . 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 CERVICAL DYSPLASIA 06/28/2006  . ALLERGIC CONJUNCTIVITIS 06/28/2006    Past Surgical History  Procedure Laterality Date  . Colposcopy      X2, biopsy only, no ablation    Family History  Problem Relation Age of Onset  . Cervical cancer      Prevelant accross maternal side of family  . High blood pressure      on maternal side  . Diabetes    . Autoimmune disease      History  Substance Use Topics  . Smoking status: Former Smoker -- 1.00 packs/day for 8 years    Types: Cigarettes    Quit date: 07/10/2012  . Smokeless tobacco: Not on file     Comment: Quit cold [place] when found out she was pregnant  . Alcohol Use: No    Allergies: No Known Allergies  Prescriptions prior to admission  Medication Sig Dispense Refill  . calcium carbonate (TUMS - DOSED IN MG ELEMENTAL CALCIUM) 500 MG chewable tablet Chew 2 tablets by mouth daily.      Clinical research associate Bandages & Supports (MEDICAL  COMPRESSION SOCKS) MISC 1 Units by Does not apply route daily.  1 each  1  . famotidine (PEPCID) 40 MG tablet Take 1 tablet (40 mg total) by mouth daily.  90 tablet  1  . Prenatal Vit-Fe Fumarate-FA (PRENATAL MULTIVITAMIN) TABS Take 1 tablet by mouth daily at 12 noon.      . fluconazole (DIFLUCAN) 150 MG tablet Take 1 tablet (150 mg total) by mouth once. Repeat in 3 days  2 tablet  0    Review of Systems  Constitutional: Negative.   HENT: Negative.   Eyes: Negative.   Respiratory: Negative.   Cardiovascular: Negative.   Gastrointestinal: Positive for abdominal pain (uc's).  Genitourinary: Negative.   Musculoskeletal: Negative.   Skin: Negative.   Neurological: Negative.   Endo/Heme/Allergies: Negative.   Psychiatric/Behavioral: Negative.    Physical Exam   Blood pressure 119/71, pulse 85, temperature 98 F (36.7 C), resp. rate 20, height 5' 5.5" (1.664 m), weight 72.303 kg (159 lb 6.4 oz), last menstrual period 05/28/2012.  Physical Exam  Constitutional: She is oriented to person, place, and time. She appears well-developed and well-nourished.  HENT:  Head: Normocephalic.  Neck: Normal range of motion.  Cardiovascular: Normal rate.   Respiratory: Effort normal.  GI: Soft. There is no tenderness.  gravid  Genitourinary:  SVE: 1.5/40/-2, posterior, vtx  Musculoskeletal: Normal range of motion.  Neurological: She is alert and oriented to person, place, and time.  Skin: Skin is warm and dry.  Psychiatric: She has a normal mood and affect. Her behavior is normal. Judgment and thought content normal.   FHR: 135, mod variability, 15x15accels, no decels=Cat I UCs: irregular, mild, q 1-9 w/ UI MAU Course  Procedures  NST SVE x 2 w/o change UA Results for orders placed during the hospital encounter of 02/01/13 (from the past 24 hour(s))  URINALYSIS, ROUTINE W REFLEX MICROSCOPIC     Status: Abnormal   Collection Time    02/01/13  9:56 PM      Result Value Range   Color,  Urine YELLOW  YELLOW   APPearance CLEAR  CLEAR   Specific Gravity, Urine <1.005 (*) 1.005 - 1.030   pH 6.0  5.0 - 8.0   Glucose, UA NEGATIVE  NEGATIVE mg/dL   Hgb urine dipstick NEGATIVE  NEGATIVE   Bilirubin Urine NEGATIVE  NEGATIVE   Ketones, ur NEGATIVE  NEGATIVE mg/dL   Protein, ur NEGATIVE  NEGATIVE mg/dL   Urobilinogen, UA 0.2  0.0 - 1.0 mg/dL   Nitrite NEGATIVE  NEGATIVE   Leukocytes, UA NEGATIVE  NEGATIVE     Assessment and Plan  A:   [redacted]w[redacted]d SIUP  G4P2012   Cat I FHR  BH vs. early PTL   P:  D/C home   Keep appt as scheduled w/ Dr. Berline Chough on 10/10  Reviewed ptl s/s, fkc, reasons to return  PO fluids, rest, empty bladder frequently    Marge Duncans 02/01/2013, 10:27 PM

## 2013-02-02 DIAGNOSIS — O479 False labor, unspecified: Secondary | ICD-10-CM

## 2013-02-07 ENCOUNTER — Ambulatory Visit (INDEPENDENT_AMBULATORY_CARE_PROVIDER_SITE_OTHER): Payer: Managed Care, Other (non HMO) | Admitting: Sports Medicine

## 2013-02-07 VITALS — BP 108/69 | Temp 97.7°F | Wt 159.0 lb

## 2013-02-07 DIAGNOSIS — Z331 Pregnant state, incidental: Secondary | ICD-10-CM

## 2013-02-07 DIAGNOSIS — F17200 Nicotine dependence, unspecified, uncomplicated: Secondary | ICD-10-CM

## 2013-02-07 DIAGNOSIS — Z3490 Encounter for supervision of normal pregnancy, unspecified, unspecified trimester: Secondary | ICD-10-CM

## 2013-02-07 NOTE — Progress Notes (Signed)
Pt is a 27 y.o. [id number] [redacted]w[redacted]d by LMP who presents today for routine care Thick discharge yesterday but now resolved Some occasional contractions, had been seen at [facility]U due to [facility] vs early labor Vertex by [facility] Patient reports signing BTL papers on 01/28/2012 Considering Epidural > Pt requests we defer GC/Chlam & GBS to next week

## 2013-02-07 NOTE — Assessment & Plan Note (Signed)
QUIT SMOKING

## 2013-02-14 ENCOUNTER — Other Ambulatory Visit (HOSPITAL_COMMUNITY)
Admission: RE | Admit: 2013-02-14 | Discharge: 2013-02-14 | Disposition: A | Discharge status: Home / Self Care | Payer: Managed Care, Other (non HMO) | Source: Ambulatory Visit | Attending: Family Medicine | Admitting: Family Medicine

## 2013-02-14 ENCOUNTER — Ambulatory Visit (INDEPENDENT_AMBULATORY_CARE_PROVIDER_SITE_OTHER): Payer: Managed Care, Other (non HMO) | Admitting: Family Medicine

## 2013-02-14 VITALS — BP 122/65 | Temp 97.5°F | Wt 161.0 lb

## 2013-02-14 DIAGNOSIS — Z3483 Encounter for supervision of other normal pregnancy, third trimester: Secondary | ICD-10-CM

## 2013-02-14 DIAGNOSIS — Z3480 Encounter for supervision of other normal pregnancy, unspecified trimester: Secondary | ICD-10-CM

## 2013-02-14 DIAGNOSIS — Z113 Encounter for screening for infections with a predominantly sexual mode of transmission: Secondary | ICD-10-CM | POA: Insufficient documentation

## 2013-02-14 NOTE — Patient Instructions (Signed)
Your doing great.  GBS and cervical cultures were obtained today.  You are 1.5 cm dilated.  Follow up in 1 week with Dr. Berline Chough.

## 2013-02-14 NOTE — Progress Notes (Signed)
Melissa Shelton is a 27 y.o. [id number] at [redacted]w[redacted]d for routine follow up.  She reports back pain today.  No vaginal bleeding, irregular contractions (~3 times daily), Loss of fluid.  Good fetal movement.    See flow sheet for details.  A/P: Pregnancy at [redacted]w[redacted]d.  Doing well.   Infant feeding choice - Breast Contraception choice - BTL.  GBS/GC/Chlamydia completed today.  Labor precautions reviewed. Follow up in 1 week.

## 2013-02-20 ENCOUNTER — Inpatient Hospital Stay (HOSPITAL_COMMUNITY): Payer: Managed Care, Other (non HMO) | Admitting: Anesthesiology

## 2013-02-20 ENCOUNTER — Encounter (HOSPITAL_COMMUNITY): Payer: Managed Care, Other (non HMO) | Admitting: Anesthesiology

## 2013-02-20 ENCOUNTER — Encounter (HOSPITAL_COMMUNITY): Payer: Self-pay | Admitting: *Deleted

## 2013-02-20 ENCOUNTER — Inpatient Hospital Stay (HOSPITAL_COMMUNITY)
Admission: AD | Admit: 2013-02-20 | Discharge: 2013-02-22 | DRG: 767 | Disposition: A | Discharge status: Home / Self Care | Payer: Managed Care, Other (non HMO) | Source: Ambulatory Visit | Attending: Obstetrics & Gynecology | Admitting: Obstetrics & Gynecology

## 2013-02-20 DIAGNOSIS — O4590 Premature separation of placenta, unspecified, unspecified trimester: Principal | ICD-10-CM | POA: Diagnosis present

## 2013-02-20 DIAGNOSIS — B373 Candidiasis of vulva and vagina: Secondary | ICD-10-CM

## 2013-02-20 DIAGNOSIS — K089 Disorder of teeth and supporting structures, unspecified: Secondary | ICD-10-CM

## 2013-02-20 DIAGNOSIS — O0940 Supervision of pregnancy with grand multiparity, unspecified trimester: Secondary | ICD-10-CM

## 2013-02-20 DIAGNOSIS — F329 Major depressive disorder, single episode, unspecified: Secondary | ICD-10-CM

## 2013-02-20 DIAGNOSIS — Z302 Encounter for sterilization: Secondary | ICD-10-CM

## 2013-02-20 DIAGNOSIS — I872 Venous insufficiency (chronic) (peripheral): Secondary | ICD-10-CM

## 2013-02-20 LAB — CBC
HCT: 34.2 % — ABNORMAL LOW (ref 36.0–46.0)
Hemoglobin: 11.5 g/dL — ABNORMAL LOW (ref 12.0–15.0)
MCH: 26.4 pg (ref 26.0–34.0)
MCHC: 33.6 g/dL (ref 30.0–36.0)
MCV: 78.6 fL (ref 78.0–100.0)
RDW: 14.7 % (ref 11.5–15.5)

## 2013-02-20 MED ORDER — LACTATED RINGERS IV SOLN
INTRAVENOUS | Status: DC
  Administered 2013-02-20 – 2013-02-21 (×2): via INTRAVENOUS

## 2013-02-20 MED ORDER — ACETAMINOPHEN 325 MG PO TABS: 650.0000 mg | ORAL_TABLET | ORAL | Status: DC | PRN

## 2013-02-20 MED ORDER — IBUPROFEN 600 MG PO TABS
600.0000 mg | ORAL_TABLET | Freq: Four times a day (QID) | ORAL | Status: DC
  Administered 2013-02-21 – 2013-02-22 (×5): 600 mg via ORAL
  Filled 2013-02-20 (×6): qty 1

## 2013-02-20 MED ORDER — ONDANSETRON HCL 4 MG PO TABS: 4.0000 mg | ORAL_TABLET | ORAL | Status: DC | PRN

## 2013-02-20 MED ORDER — PRENATAL MULTIVITAMIN CH
1.0000 | ORAL_TABLET | Freq: Every day | ORAL | Status: DC
  Administered 2013-02-21: 1 via ORAL
  Filled 2013-02-20: qty 1

## 2013-02-20 MED ORDER — FENTANYL CITRATE 0.05 MG/ML IJ SOLN
100.0000 ug | Freq: Once | INTRAMUSCULAR | Status: AC
  Administered 2013-02-20: 100 ug via INTRAVENOUS

## 2013-02-20 MED ORDER — OXYTOCIN 40 UNITS IN LACTATED RINGERS INFUSION - SIMPLE MED
62.5000 mL/h | INTRAVENOUS | Status: DC
  Administered 2013-02-20: 62.5 mL/h via INTRAVENOUS
  Filled 2013-02-20: qty 1000

## 2013-02-20 MED ORDER — TETANUS-DIPHTH-ACELL PERTUSSIS 5-2.5-18.5 LF-MCG/0.5 IM SUSP: 0.5000 mL | Freq: Once | INTRAMUSCULAR | Status: DC

## 2013-02-20 MED ORDER — OXYTOCIN BOLUS FROM INFUSION: 500.0000 mL | INTRAVENOUS | Status: DC

## 2013-02-20 MED ORDER — BENZOCAINE-MENTHOL 20-0.5 % EX AERO: 1.0000 "application " | INHALATION_SPRAY | CUTANEOUS | Status: DC | PRN

## 2013-02-20 MED ORDER — LIDOCAINE HCL (PF) 1 % IJ SOLN
30.0000 mL | INTRAMUSCULAR | Status: DC | PRN
  Filled 2013-02-20 (×2): qty 30

## 2013-02-20 MED ORDER — LANOLIN HYDROUS EX OINT: TOPICAL_OINTMENT | CUTANEOUS | Status: DC | PRN

## 2013-02-20 MED ORDER — EPHEDRINE 5 MG/ML INJ
10.0000 mg | INTRAVENOUS | Status: DC | PRN
  Filled 2013-02-20: qty 2
  Filled 2013-02-20: qty 4

## 2013-02-20 MED ORDER — DIPHENHYDRAMINE HCL 25 MG PO CAPS
25.0000 mg | ORAL_CAPSULE | Freq: Four times a day (QID) | ORAL | Status: DC | PRN
  Administered 2013-02-21: 25 mg via ORAL
  Filled 2013-02-20: qty 1

## 2013-02-20 MED ORDER — ZOLPIDEM TARTRATE 5 MG PO TABS: 5.0000 mg | ORAL_TABLET | Freq: Every evening | ORAL | Status: DC | PRN

## 2013-02-20 MED ORDER — FLEET ENEMA 7-19 GM/118ML RE ENEM: 1.0000 | ENEMA | RECTAL | Status: DC | PRN

## 2013-02-20 MED ORDER — CITRIC ACID-SODIUM CITRATE 334-500 MG/5ML PO SOLN: 30.0000 mL | ORAL | Status: DC | PRN

## 2013-02-20 MED ORDER — EPHEDRINE 5 MG/ML INJ
10.0000 mg | INTRAVENOUS | Status: DC | PRN
  Filled 2013-02-20: qty 2

## 2013-02-20 MED ORDER — LACTATED RINGERS IV SOLN: 500.0000 mL | INTRAVENOUS | Status: DC | PRN

## 2013-02-20 MED ORDER — ONDANSETRON HCL 4 MG/2ML IJ SOLN: 4.0000 mg | Freq: Four times a day (QID) | INTRAMUSCULAR | Status: DC | PRN

## 2013-02-20 MED ORDER — LACTATED RINGERS IV SOLN
500.0000 mL | Freq: Once | INTRAVENOUS | Status: AC
  Administered 2013-02-20: 500 mL via INTRAVENOUS

## 2013-02-20 MED ORDER — FENTANYL CITRATE 0.05 MG/ML IJ SOLN
INTRAMUSCULAR | Status: AC
  Filled 2013-02-20: qty 2

## 2013-02-20 MED ORDER — WITCH HAZEL-GLYCERIN EX PADS: 1.0000 "application " | MEDICATED_PAD | CUTANEOUS | Status: DC | PRN

## 2013-02-20 MED ORDER — OXYCODONE-ACETAMINOPHEN 5-325 MG PO TABS: 1.0000 | ORAL_TABLET | ORAL | Status: DC | PRN

## 2013-02-20 MED ORDER — DIPHENHYDRAMINE HCL 50 MG/ML IJ SOLN: 12.5000 mg | INTRAMUSCULAR | Status: DC | PRN

## 2013-02-20 MED ORDER — GI COCKTAIL ~~LOC~~
30.0000 mL | Freq: Once | ORAL | Status: AC
  Administered 2013-02-20: 30 mL via ORAL
  Filled 2013-02-20: qty 30

## 2013-02-20 MED ORDER — FENTANYL 2.5 MCG/ML BUPIVACAINE 1/10 % EPIDURAL INFUSION (WH - ANES)
14.0000 mL/h | INTRAMUSCULAR | Status: DC | PRN
  Administered 2013-02-20: 14 mL/h via EPIDURAL
  Filled 2013-02-20: qty 125

## 2013-02-20 MED ORDER — PHENYLEPHRINE 40 MCG/ML (10ML) SYRINGE FOR IV PUSH (FOR BLOOD PRESSURE SUPPORT)
80.0000 ug | PREFILLED_SYRINGE | INTRAVENOUS | Status: DC | PRN
  Filled 2013-02-20: qty 5
  Filled 2013-02-20: qty 2

## 2013-02-20 MED ORDER — SENNOSIDES-DOCUSATE SODIUM 8.6-50 MG PO TABS
2.0000 | ORAL_TABLET | ORAL | Status: DC
  Administered 2013-02-21: 2 via ORAL
  Filled 2013-02-20: qty 2

## 2013-02-20 MED ORDER — SIMETHICONE 80 MG PO CHEW: 80.0000 mg | CHEWABLE_TABLET | ORAL | Status: DC | PRN

## 2013-02-20 MED ORDER — DIBUCAINE 1 % RE OINT: 1.0000 "application " | TOPICAL_OINTMENT | RECTAL | Status: DC | PRN

## 2013-02-20 MED ORDER — ONDANSETRON HCL 4 MG/2ML IJ SOLN: 4.0000 mg | INTRAMUSCULAR | Status: DC | PRN

## 2013-02-20 MED ORDER — IBUPROFEN 600 MG PO TABS
600.0000 mg | ORAL_TABLET | Freq: Four times a day (QID) | ORAL | Status: DC | PRN
  Administered 2013-02-20: 600 mg via ORAL

## 2013-02-20 MED ORDER — PHENYLEPHRINE 40 MCG/ML (10ML) SYRINGE FOR IV PUSH (FOR BLOOD PRESSURE SUPPORT)
80.0000 ug | PREFILLED_SYRINGE | INTRAVENOUS | Status: DC | PRN
  Filled 2013-02-20: qty 2

## 2013-02-20 MED ORDER — LIDOCAINE HCL (PF) 1 % IJ SOLN
INTRAMUSCULAR | Status: DC | PRN
  Administered 2013-02-20 (×2): 5 mL

## 2013-02-20 NOTE — Anesthesia Procedure Notes (Signed)
Epidural Patient location during procedure: OB Start time: 02/20/2013 12:01 PM  Staffing Anesthesiologist: Angus Seller., Harrell Gave. Performed by: anesthesiologist   Preanesthetic Checklist Completed: patient identified, site marked, surgical consent, pre-op evaluation, timeout performed, IV checked, risks and benefits discussed and monitors and equipment checked  Epidural Patient position: sitting Prep: site prepped and draped and DuraPrep Patient monitoring: continuous pulse ox and blood pressure Approach: midline Injection technique: LOR air and LOR saline  Needle:  Needle type: Tuohy  Needle gauge: 17 G Needle length: 9 cm and 9 Needle insertion depth: 5 cm cm Catheter type: closed end flexible Catheter size: 19 Gauge Catheter at skin depth: 10 cm Test dose: negative  Assessment Events: blood not aspirated, injection not painful, no injection resistance, negative IV test and no paresthesia  Additional Notes Patient identified.  Risk benefits discussed including failed block, incomplete pain control, headache, nerve damage, paralysis, blood pressure changes, nausea, vomiting, reactions to medication both toxic or allergic, and postpartum back pain.  Patient expressed understanding and wished to proceed.  All questions were answered.  Sterile technique used throughout procedure and epidural site dressed with sterile barrier dressing. No paresthesia or other complications noted.The patient did not experience any signs of intravascular injection such as tinnitus or metallic taste in mouth nor signs of intrathecal spread such as rapid motor block. Please see nursing notes for vital signs.

## 2013-02-20 NOTE — Progress Notes (Signed)
Melissa Shelton is a 27 y.o. [phone] at [redacted]w[redacted]d admitted for active labor  Subjective: Patient feeling well after epidural.   Objective: BP 108/60  Pulse 95  Temp(Src) 98 F (36.7 C) (Oral)  Resp 18  SpO2 100%  LMP 05/28/2012      FHT:  FHR: 130 bpm, variability: moderate,  accelerations:  Present,  decelerations:  Present Variable decels UC:   regular, every 1-3 minutes SVE:   Dilation: 8 Effacement (%): 100 Station: -1 Exam by:: Dr Adriana Simas  Labs: Lab Results  Component Value Date   WBC 22.7* 02/20/2013   HGB 11.5* 02/20/2013   HCT 34.2* 02/20/2013   MCV 78.6 02/20/2013   PLT 154 02/20/2013    Assessment / Plan: Spontaneous labor, progressing normally Membranes ruptured due to variable decels.   Labor: Progressing normally Fetal Wellbeing:  Category II Pain Control:  Epidural Anticipated MOD:  NSVD  Everlene Other 02/20/2013, 1:28 PM

## 2013-02-20 NOTE — H&P (Signed)
Melissa Shelton is a 27 y.o. female [phone] with IUP at [redacted]w[redacted]d by LMP c/w 20 week [place] presenting for labor.   Pt states she was contracting all night and then this AM around 6am her contractions worsened significantly. At 7 am, she had slight bleeding and it made her decide to come in.  Pt has good fetal movement and no LOF.    [facility] 18 weeks. Normal progress through her pregnancy. GBS neg. Normal anatomy. Normal quad screen.     Prenatal History/Complications:  Past Medical History: Past Medical History  Diagnosis Date  . RHINITIS, ALLERGIC 06/28/2006  . TOBACCO USER 05/03/2009  . MENSTRUATION, PAINFUL 06/28/2006  . 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 CERVICAL DYSPLASIA 06/28/2006  . ALLERGIC CONJUNCTIVITIS 06/28/2006    Past Surgical History: Past Surgical History  Procedure Laterality Date  . Colposcopy      X2, biopsy only, no ablation  . Appendectomy      Obstetrical History: OB History   Grav Para Term Preterm Abortions TAB SAB Ect Mult Living   4 2 2  1 1    2     G1 Tab G2, G3- NSVD, 8lb 2oz    Social History: History   Social History  . Marital Status: Single    Spouse Name: N/A    Number of Children: N/A  . Years of Education: N/A   Social History Main Topics  . Smoking status: Current Every Day Smoker -- 0.50 packs/day for 8 years    Types: Cigarettes    Last Attempt to Quit: 07/10/2012  . Smokeless tobacco: None     Comment: Quit cold [place] when found out she was pregnant  . Alcohol Use: No  . Drug Use: No  . Sexual Activity: None   Other Topics Concern  . None   Social History Narrative   Lives in [place], with 2 daughters & friend with 2 children   Works at [employer], Clinical biochemist    Family History: Family History  Problem Relation Age of Onset  . Cervical cancer      Prevelant accross maternal side of family  . High blood pressure      on maternal side  . Diabetes    . Autoimmune disease      Allergies: No  Known Allergies  Prescriptions prior to admission  Medication Sig Dispense Refill  . Prenatal Vit-Fe Fumarate-FA (PRENATAL MULTIVITAMIN) TABS Take 1 tablet by mouth daily at 12 noon.      Clinical research associate Bandages & Supports (MEDICAL COMPRESSION SOCKS) MISC 1 Units by Does not apply route daily.  1 each  1     Review of Systems   Constitutional: Negative for fever, chills, weight loss, malaise/fatigue and diaphoresis.  HENT: Negative for hearing loss, ear pain, nosebleeds, congestion, sore throat, neck pain, tinnitus and ear discharge.   Eyes: Negative for blurred vision, double vision, photophobia, pain, discharge and redness.  Respiratory: Negative for cough, hemoptysis, sputum production, shortness of breath, wheezing and stridor.   Cardiovascular: Negative for chest pain, palpitations, orthopnea,  leg swelling  Gastrointestinal: Positive for abdominal pain. Negative for heartburn, nausea, vomiting, diarrhea, constipation, blood in stool Genitourinary: Negative for dysuria, urgency, frequency, hematuria and flank pain.  Musculoskeletal: Negative for myalgias, back pain, joint pain and falls.  Skin: Negative for itching and rash.  Neurological: Negative for dizziness, tingling, tremors, sensory change, speech change, focal weakness, seizures, loss of consciousness, weakness and headaches.  Endo/Heme/Allergies: Negative for environmental  allergies and polydipsia. Does not bruise/bleed easily.  Psychiatric/Behavioral: Negative for depression, suicidal ideas, hallucinations, memory loss and substance abuse. The patient is not nervous/anxious and does not have insomnia.       Blood pressure 108/60, pulse 95, temperature 98 F (36.7 C), temperature source Oral, resp. rate 18, last menstrual period 05/28/2012, SpO2 100.00%. General appearance: alert, cooperative and uncomfortable with contractions Lungs: clear to auscultation bilaterally Heart: regular rate and rhythm Abdomen: soft, non-tender;  bowel sounds normal Pelvic: 6/90/-2, slight trickling of blood at the os Extremities: Homans sign is negative, no sign of DVT Presentation: cephalic Fetal monitoringBaseline: 130 bpm, Variability: Good {> 6 bpm), Accelerations: Reactive and Decelerations: Absent Uterine activityevery 2-5 min  Dilation: 8 Effacement (%): 100 Station: -1 Exam by:: Dr Adriana Simas   Prenatal labs: ABO, Rh: O/POS/-- (03/27 1032) Antibody: NEG (03/27 1032) Rubella:   RPR: NON REAC (09/25 0956)  HBsAg: NEGATIVE (03/27 1032)  HIV: NON REACTIVE (09/25 0956)  GBS: NEGATIVE (10/17 1652)  1 hr Glucola 112 Genetic screening  Normal quad Anatomy US normal    Assessment: Melissa Shelton is a 27 y.o. [id number] with an IUP at [redacted]w[redacted]d presenting for active labor and vaginal bleeding.   Plan:  1) Admit to L&D -routine orders - epidural prn  - admit and AROM  2) vaginal bleeding - concerning for possible placental abruption - cat I tracing - will monitor baby closely - AROM and attempt to progress quickly.   3) FWB - cat I tracing - GBS neg - EFW 8lb  4) anticipate SVD   Floraine Buechler L, MD 02/20/2013, 1:34 PM

## 2013-02-20 NOTE — Anesthesia Preprocedure Evaluation (Signed)

## 2013-02-20 NOTE — MAU Note (Signed)
uc's during the night, more frequent since 0530, denies bleeding or LOF.  Does have bloody show.

## 2013-02-21 ENCOUNTER — Inpatient Hospital Stay (HOSPITAL_COMMUNITY): Payer: Managed Care, Other (non HMO) | Admitting: Anesthesiology

## 2013-02-21 ENCOUNTER — Encounter: Payer: Managed Care, Other (non HMO) | Admitting: Family Medicine

## 2013-02-21 ENCOUNTER — Encounter (HOSPITAL_COMMUNITY): Payer: Managed Care, Other (non HMO) | Admitting: Anesthesiology

## 2013-02-21 ENCOUNTER — Encounter (HOSPITAL_COMMUNITY)
Admission: AD | Disposition: A | Discharge status: Home / Self Care | Payer: Self-pay | Source: Ambulatory Visit | Attending: Obstetrics & Gynecology

## 2013-02-21 DIAGNOSIS — Z302 Encounter for sterilization: Secondary | ICD-10-CM

## 2013-02-21 HISTORY — PX: TUBAL LIGATION: SHX77

## 2013-02-21 LAB — SURGICAL PCR SCREEN
MRSA, PCR: NEGATIVE
Staphylococcus aureus: NEGATIVE

## 2013-02-21 LAB — CBC
Hemoglobin: 9.8 g/dL — ABNORMAL LOW (ref 12.0–15.0)
MCHC: 33.7 g/dL (ref 30.0–36.0)
RBC: 3.67 MIL/uL — ABNORMAL LOW (ref 3.87–5.11)

## 2013-02-21 SURGERY — LIGATION, FALLOPIAN TUBE, POSTPARTUM
Anesthesia: Epidural | Site: Abdomen | Laterality: Bilateral | Wound class: Clean Contaminated

## 2013-02-21 SURGERY — Surgical Case
Anesthesia: *Unknown

## 2013-02-21 MED ORDER — FENTANYL CITRATE 0.05 MG/ML IJ SOLN
INTRAMUSCULAR | Status: AC
  Filled 2013-02-21: qty 2

## 2013-02-21 MED ORDER — MIDAZOLAM HCL 2 MG/2ML IJ SOLN
INTRAMUSCULAR | Status: AC
  Filled 2013-02-21: qty 2

## 2013-02-21 MED ORDER — LIDOCAINE-EPINEPHRINE (PF) 2 %-1:200000 IJ SOLN
INTRAMUSCULAR | Status: AC
  Filled 2013-02-21: qty 40

## 2013-02-21 MED ORDER — KETOROLAC TROMETHAMINE 30 MG/ML IJ SOLN: 15.0000 mg | Freq: Once | INTRAMUSCULAR | Status: DC | PRN

## 2013-02-21 MED ORDER — FAMOTIDINE 20 MG PO TABS
40.0000 mg | ORAL_TABLET | Freq: Once | ORAL | Status: AC
Start: 2013-02-21 — End: 2013-02-21
  Administered 2013-02-21: 40 mg via ORAL
  Filled 2013-02-21: qty 2

## 2013-02-21 MED ORDER — ONDANSETRON HCL 4 MG/2ML IJ SOLN
INTRAMUSCULAR | Status: AC
  Filled 2013-02-21: qty 2

## 2013-02-21 MED ORDER — FENTANYL CITRATE 0.05 MG/ML IJ SOLN
INTRAMUSCULAR | Status: DC | PRN
  Administered 2013-02-21: 100 ug via EPIDURAL
  Administered 2013-02-21 (×2): 50 ug via INTRAVENOUS

## 2013-02-21 MED ORDER — PROMETHAZINE HCL 25 MG/ML IJ SOLN: 6.2500 mg | INTRAMUSCULAR | Status: DC | PRN

## 2013-02-21 MED ORDER — LIDOCAINE-EPINEPHRINE (PF) 2 %-1:200000 IJ SOLN
INTRAMUSCULAR | Status: DC | PRN
  Administered 2013-02-21 (×2): 5 mL via EPIDURAL
  Administered 2013-02-21: 3 mL via EPIDURAL
  Administered 2013-02-21 (×2): 5 mL via EPIDURAL

## 2013-02-21 MED ORDER — FENTANYL CITRATE 0.05 MG/ML IJ SOLN: 25.0000 ug | INTRAMUSCULAR | Status: DC | PRN

## 2013-02-21 MED ORDER — MIDAZOLAM HCL 2 MG/2ML IJ SOLN
INTRAMUSCULAR | Status: DC | PRN
  Administered 2013-02-21: 2 mg via INTRAVENOUS

## 2013-02-21 MED ORDER — BUPIVACAINE HCL (PF) 0.25 % IJ SOLN
INTRAMUSCULAR | Status: DC | PRN
  Administered 2013-02-21: 6 mL

## 2013-02-21 MED ORDER — MIDAZOLAM HCL 2 MG/2ML IJ SOLN: 0.5000 mg | Freq: Once | INTRAMUSCULAR | Status: DC | PRN

## 2013-02-21 MED ORDER — ONDANSETRON HCL 4 MG/2ML IJ SOLN
INTRAMUSCULAR | Status: DC | PRN
  Administered 2013-02-21: 4 mg via INTRAVENOUS

## 2013-02-21 MED ORDER — METOCLOPRAMIDE HCL 10 MG PO TABS
10.0000 mg | ORAL_TABLET | Freq: Once | ORAL | Status: AC
  Administered 2013-02-21: 10 mg via ORAL
  Filled 2013-02-21: qty 1

## 2013-02-21 MED ORDER — MEPERIDINE HCL 25 MG/ML IJ SOLN: 6.2500 mg | INTRAMUSCULAR | Status: DC | PRN

## 2013-02-21 MED ORDER — LACTATED RINGERS IV SOLN
INTRAVENOUS | Status: DC
  Administered 2013-02-21: 08:00:00 via INTRAVENOUS

## 2013-02-21 SURGICAL SUPPLY — 20 items
BLADE SURG 11 STRL SS (BLADE) ×2 IMPLANT
CHLORAPREP W/TINT 26ML (MISCELLANEOUS) ×2 IMPLANT
CLIP FILSHIE TUBAL LIGA STRL (Clip) ×2 IMPLANT
CLOTH BEACON ORANGE TIMEOUT ST (SAFETY) ×2 IMPLANT
DRSG COVADERM PLUS 2X2 (GAUZE/BANDAGES/DRESSINGS) ×2 IMPLANT
GLOVE BIO SURGEON STRL SZ 6.5 (GLOVE) ×2 IMPLANT
GLOVE BIOGEL PI IND STRL 7.0 (GLOVE) ×1 IMPLANT
GLOVE BIOGEL PI INDICATOR 7.0 (GLOVE) ×1
GOWN PREVENTION PLUS LG XLONG (DISPOSABLE) ×4 IMPLANT
NEEDLE HYPO 25X1 1.5 SAFETY (NEEDLE) ×2 IMPLANT
NS IRRIG 1000ML POUR BTL (IV SOLUTION) ×2 IMPLANT
PACK ABDOMINAL MINOR (CUSTOM PROCEDURE TRAY) ×2 IMPLANT
SPONGE LAP 4X18 X RAY DECT (DISPOSABLE) IMPLANT
SUT VIC AB 0 CT1 27 (SUTURE) ×1
SUT VIC AB 0 CT1 27XBRD ANBCTR (SUTURE) ×1 IMPLANT
SUT VICRYL 4-0 PS2 18IN ABS (SUTURE) ×2 IMPLANT
SYR CONTROL 10ML LL (SYRINGE) ×2 IMPLANT
TOWEL OR 17X24 6PK STRL BLUE (TOWEL DISPOSABLE) ×4 IMPLANT
TRAY FOLEY BAG SILVER LF 14FR (CATHETERS) ×2 IMPLANT
WATER STERILE IRR 1000ML POUR (IV SOLUTION) ×2 IMPLANT

## 2013-02-21 NOTE — Progress Notes (Signed)
Patient scheduled for pp BTL.The procedure and the risk of anesthesia, bleeding, infection, bowel and bladder injury, failure (1/200) and ectopic pregnancy were discussed and her questions were answered. The procedure will be preformed today with epidural anesthesia.  Adam Phenix, MD 02/21/2013 9:36 AM

## 2013-02-21 NOTE — Progress Notes (Signed)
Post Partum Day 1  Subjective: no complaints, up ad lib and voiding   Objective: Blood pressure 109/67, pulse 84, temperature 97.8 F (36.6 C), temperature source Oral, resp. rate 18, height 5' 5.5" (1.664 m), weight 161 lb (73.029 kg), last menstrual period 05/28/2012, SpO2 97.00%, unknown if currently breastfeeding.  Physical Exam:  General: alert, cooperative and no distress Lochia: appropriate Uterine Fundus: firm DVT Evaluation: No evidence of DVT seen on physical exam.   Recent Labs  02/20/13 1110 02/21/13 0620  HGB 11.5* 9.8*  HCT 34.2* 29.1*    Assessment/Plan: Lactation consult Patient to go for Tubal today Planned discharge tomorrow.    LOS: 1 day   Everlene Other 02/21/2013, 8:07 AM

## 2013-02-21 NOTE — Transfer of Care (Signed)
Immediate Anesthesia Transfer of Care Note  Patient: Melissa Shelton  Procedure(s) Performed: Procedure(s): POST PARTUM TUBAL LIGATION (Bilateral)  Patient Location: PACU  Anesthesia Type:Epidural  Level of Consciousness: awake  Airway & Oxygen Therapy: Patient Spontanous Breathing  Post-op Assessment: Report given to PACU RN  Post vital signs: Reviewed and stable  Complications: No apparent anesthesia complications

## 2013-02-21 NOTE — Anesthesia Preprocedure Evaluation (Signed)
Anesthesia Evaluation  Patient identified by MRN, date of birth, ID band Patient awake    Reviewed: Allergy & Precautions, H&P , Patient's Chart, lab work & pertinent test results  Airway Mallampati: II TM Distance: >3 FB Neck ROM: full    Dental no notable dental hx.    Pulmonary neg pulmonary ROS,  breath sounds clear to auscultation  Pulmonary exam normal       Cardiovascular negative cardio ROS  Rhythm:regular Rate:Normal     Neuro/Psych negative neurological ROS  negative psych ROS   GI/Hepatic negative GI ROS, Neg liver ROS,   Endo/Other  negative endocrine ROS  Renal/GU negative Renal ROS     Musculoskeletal   Abdominal   Peds  Hematology negative hematology ROS (+)   Anesthesia Other Findings   Reproductive/Obstetrics                           Anesthesia Physical  Anesthesia Plan  ASA: II  Anesthesia Plan: Epidural   Post-op Pain Management:    Induction:   Airway Management Planned:   Additional Equipment:   Intra-op Plan:   Post-operative Plan:   Informed Consent: I have reviewed the patients History and Physical, chart, labs and discussed the procedure including the risks, benefits and alternatives for the proposed anesthesia with the patient or authorized representative who has indicated his/her understanding and acceptance.     Plan Discussed with:   Anesthesia Plan Comments:         Anesthesia Quick Evaluation

## 2013-02-21 NOTE — Op Note (Signed)
Melissa Shelton 02/20/2013 - 02/21/2013  PREOPERATIVE DIAGNOSIS:  Multiparity, undesired fertility  POSTOPERATIVE DIAGNOSIS:  Multiparity, undesired fertility  PROCEDURE:  Postpartum Bilateral Tubal Sterilization using Filshie Clips   ANESTHESIA:  Epidural  COMPLICATIONS:  None immediate.  ESTIMATED BLOOD LOSS:  Less than 20 ml.  FLUIDS: 500 ml LR.  URINE OUTPUT:  50 ml of clear urine.  INDICATIONS: 27 y.o. [id number]  with undesired fertility,status post vaginal delivery, desires permanent sterilization. Risks and benefits of procedure discussed with patient including permanence of method, bleeding, infection, injury to surrounding organs and need for additional procedures. Risk failure of 0.5-1% with increased risk of ectopic gestation if pregnancy occurs was also discussed with patient.   FINDINGS:  Normal uterus, tubes, and ovaries.  TECHNIQUE:  The patient was taken to the operating room where her epidural anesthesia was dosed up to surgical level and found to be adequate.  She was then placed in the dorsal supine position and prepped and draped in sterile fashion.  After an adequate timeout was performed, attention was turned to the patient's abdomen where a small transverse skin incision was made under the umbilical fold. The incision was taken down to the layer of fascia using the scalpel, and fascia was incised, and extended bilaterally using Mayo scissors. The peritoneum was entered in a sharp fashion. Attention was then turned to the patient's uterus, and left fallopian tube was identified and followed out to the fimbriated end.  A Filshie clip was placed on the left fallopian tube about 2 cm from the cornual attachment, with care given to incorporate the underlying mesosalpinx.  A similar process was carried out on the rightl side allowing for bilateral tubal sterilization.  Good hemostasis was noted overall.  Local analgesia was drizzled on both operative sites.The instruments were  then removed from the patient's abdomen and the fascial incision was repaired with 0 Vicryl, and the skin was closed with a 3-0 Monocryl subcuticular stitch. The patient tolerated the procedure well.  Sponge, lap, and needle counts were correct times two.  The patient was then taken to the recovery room awake, extubated and in stable condition.  Adam Phenix, MD  02/21/2013 12:20 PM

## 2013-02-21 NOTE — Anesthesia Postprocedure Evaluation (Signed)
Anesthesia Post Note  Patient: Melissa Shelton, Melissa Shelton  Procedure(s) Performed: Procedure(s) (LRB): POST PARTUM TUBAL LIGATION (Bilateral)  Anesthesia type: Epidural  Patient location: PACU  Post pain: Pain level controlled  Post assessment: Post-op Vital signs reviewed  Last Vitals:  Filed Vitals:   02/21/13 1150  BP:   Pulse: 91  Temp: 36.7 C  Resp: 16    Post vital signs: Reviewed  Level of consciousness: awake  Complications: No apparent anesthesia complications

## 2013-02-22 MED ORDER — IBUPROFEN 600 MG PO TABS: 600.0000 mg | ORAL_TABLET | Freq: Four times a day (QID) | ORAL | Status: DC

## 2013-02-22 NOTE — Anesthesia Postprocedure Evaluation (Signed)
Anesthesia Post Note  Patient: Melissa Shelton, Melissa Shelton  Procedure(s) Performed: Procedure(s) (LRB): POST PARTUM TUBAL LIGATION (Bilateral)  Anesthesia type: Epidural  Patient location: Mother/Baby  Post pain: Pain level controlled  Post assessment: Post-op Vital signs reviewed  Last Vitals:  Filed Vitals:   02/22/13 0545  BP: 109/63  Pulse: 73  Temp: 36.8 C  Resp: 18    Post vital signs: Reviewed  Level of consciousness:alert  Complications: No apparent anesthesia complications

## 2013-02-22 NOTE — Discharge Summary (Signed)
Obstetric Discharge Summary Reason for Admission: onset of labor Prenatal Procedures: ultrasound Intrapartum Procedures: spontaneous vaginal delivery Postpartum Procedures: none Complications-Operative and Postpartum: none; Partial placental abruption Hemoglobin  Date Value Range Status  02/21/2013 9.8* 12.0 - 15.0 g/dL Final     HCT  Date Value Range Status  02/21/2013 29.1* 36.0 - 46.0 % Final    Physical Exam:  General: alert, cooperative and no distress Lochia: appropriate Uterine Fundus: firm Incision: small midline incision from Tubal, healing well.  DVT Evaluation: No evidence of DVT seen on physical exam. Negative Homan's sign. No cords or calf tenderness. No significant calf/ankle edema.  Discharge Diagnoses: Term Pregnancy-delivered  Discharge Information: Date: 02/22/2013 Activity: pelvic rest for 6 weeks.  Diet: routine Medications: PNV and Ibuprofen Condition: stable Instructions: refer to practice specific booklet Discharge to: home   Newborn Data: Live born female  Birth Weight: 6 lb 13.7 oz (3110 g) APGAR: 9, 9  Home with mother.  Everlene Other 02/22/2013, 7:19 AM   I was present for the exam and agree with above.  [place], CNM 02/22/2013 8:46 AM

## 2013-02-22 NOTE — Lactation Note (Signed)
This note was copied from the chart of Melissa [employer]. Lactation Consultation Note: follow up visit with mom before DC. Mom reports that baby fed all night and her nipples are sore. Thinks she is not getting a good latch. Mom reports that baby just fed about 1/2 hour ago for 10 minutes. Mom reports that her breasts are feeling fuller this morning. Assisted with latch but baby is too sleepy at this time. Reviewed basic teaching- encouraged mom to hold breast throughout the feeding and keep the baby close to the breast. Wait for wide open mouth. Comfort gels given with instructions for use. Manual pump and #27 flange given with instructions for use and cleaning. Discussed engorgement prevention and treatment, No questions at present. To call for assist if baby wakes before DC.   Patient Name: Melissa Shelton IONGE'X Date: 02/22/2013 Reason for consult: Follow-up assessment   Maternal Data    Feeding Feeding Type: Breast Fed  LATCH Score/Interventions Latch: Too sleepy or reluctant, no latch achieved, no sucking elicited.  Audible Swallowing: None  Type of Nipple: Everted at rest and after stimulation  Comfort (Breast/Nipple): Filling, red/small blisters or bruises, mild/mod discomfort  Problem noted: Filling;Mild/Moderate discomfort Interventions (Filling): Hand pump Interventions (Mild/moderate discomfort): Comfort gels  Hold (Positioning): Assistance needed to correctly position infant at breast and maintain latch. Intervention(s): Breastfeeding basics reviewed;Support Pillows;Position options  LATCH Score: 4  Lactation Tools Discussed/Used Tools: Comfort gels   Consult Status Consult Status: Follow-up Date: 02/22/13 Follow-up type: In-patient    [name] 02/22/2013, 8:49 AM

## 2013-02-22 NOTE — Lactation Note (Signed)
This note was copied from the chart of [employer]. Lactation Consultation Note  Assisted mom with deeper latch and she reports that feels much better. Encouraged holding her breast throughout the feeding and keeping the baby close to the breast. Mom reports that her breast feels softer after nursing. No further questions at present. To call prn  Patient Name: Melissa Shelton [id number] Date: 02/22/2013 Reason for consult: Follow-up assessment   Maternal Data    Feeding Feeding Type: Breast Fed Length of feed: 15 min  LATCH Score/Interventions Latch: Grasps breast easily, tongue down, lips flanged, rhythmical sucking.  Audible Swallowing: A few with stimulation  Type of Nipple: Everted at rest and after stimulation  Comfort (Breast/Nipple): Filling, red/small blisters or bruises, mild/mod discomfort  Problem noted: Filling;Mild/Moderate discomfort Interventions (Filling): Hand pump Interventions (Mild/moderate discomfort): Comfort gels  Hold (Positioning): Assistance needed to correctly position infant at breast and maintain latch. Intervention(s): Breastfeeding basics reviewed;Support Pillows;Position options  LATCH Score: 7  Lactation Tools Discussed/Used Tools: Comfort gels   Consult Status Consult Status: Complete Date: 02/22/13 Follow-up type: In-patient    [name] 02/22/2013, 10:17 AM

## 2013-02-22 NOTE — Anesthesia Postprocedure Evaluation (Signed)
Anesthesia Post Note  Patient: Melissa Shelton, English as a foreign language  Procedure(s) Performed: * No procedures listed *  Anesthesia type: Epidural  Patient location: Mother/Baby  Post pain: Pain level controlled  Post assessment: Post-op Vital signs reviewed  Last Vitals: BP 109/63  Pulse 73  Temp(Src) 36.8 C (Oral)  Resp 18  Ht 5' 5.5" (1.664 m)  Wt 161 lb (73.029 kg)  BMI 26.37 kg/m2  SpO2 95%  LMP 05/28/2012  Post vital signs: Reviewed  Level of consciousness: awake  Complications: No apparent anesthesia complications

## 2013-02-24 ENCOUNTER — Encounter (HOSPITAL_COMMUNITY): Payer: Self-pay | Admitting: Obstetrics & Gynecology

## 2013-02-25 NOTE — H&P (Signed)
Attestation of Attending Supervision of Fellow: Evaluation and management procedures were performed by the Fellow under my supervision and collaboration.  I have reviewed the Fellow's note and chart, and I agree with the management and plan.    

## 2013-02-28 ENCOUNTER — Encounter: Payer: Managed Care, Other (non HMO) | Admitting: Sports Medicine

## 2013-02-28 ENCOUNTER — Telehealth: Payer: Self-pay | Admitting: Sports Medicine

## 2013-02-28 NOTE — Telephone Encounter (Signed)
Placed in MD box

## 2013-02-28 NOTE — Telephone Encounter (Signed)
Pt needs form filled out regarding medical leave for maternity leave from work.

## 2013-03-03 ENCOUNTER — Ambulatory Visit (HOSPITAL_COMMUNITY)
Admission: RE | Admit: 2013-03-03 | Discharge: 2013-03-03 | Disposition: A | Discharge status: Home / Self Care | Payer: Managed Care, Other (non HMO) | Source: Ambulatory Visit | Attending: Sports Medicine | Admitting: Sports Medicine

## 2013-03-03 NOTE — Telephone Encounter (Signed)
Pt called and was told forms were ready to be picked up. JW

## 2013-03-03 NOTE — Telephone Encounter (Signed)
Placed form up front for pick up

## 2013-03-03 NOTE — Lactation Note (Signed)
Adult Lactation Consultation Outpatient Visit Note  Patient Name: Melissa Shelton                  BABY: Melissa Shelton Date of Birth: [birth year]                             DOB: [birth year] Gestational Age at Delivery: [redacted]w[redacted]d           BIRTH WEIGHT: 6-13.7 Type of Delivery: NVD                                DISCHARGE WEIGHT: 6-7.2                                                                     WEIGHT TODAY: 7-4.6 Breastfeeding History: Frequency of Breastfeeding: EVERY 2-3 HOURS Length of Feeding: 10-20 MINUTES Voids: QS Stools: QS  Supplementing / Method:NONE Pumping:  Type of Pump:MEDELA DEBP   Frequency:NONE  Volume:    Comments:    Consultation Evaluation:  Mom and 10 day old infant here for feeding assist.  Mom has had cracked bleeding nipples which she states are healing since staring All Purpose Nipple Ointment 2 days ago.  Mom has very fair skin.  Both nipples and areola noted to be very pink with healing scabs on tips.  Assisted with correct techniques for both cross cradle and football holds.  Adjusted positioning and worked with obtaining deeper latch.  Baby latched easily and nursed actively with active suck/swallows.  Mom states feeding is comfortable and nipples rounded after feeding.  Reviewed basics and answered questions.  Initial Feeding Assessment:LEFT BREAST 15 MINUTES Pre-feed Weight:3306 Post-feed [id number]:0454 Amount Transferred:42 Comments:  Additional Feeding Assessment:RIGHT BREAST 10 MINUTES Pre-feed [id number]:1478 Post-feed [id number]:3086 Amount Transferred:28 Comments:  Additional Feeding Assessment: Pre-feed Weight: Post-feed Weight: Amount Transferred: Comments:  Total Breast milk Transferred this Visit: 70 MLS.  Total Supplement Given: NONE  Additional Interventions:   Follow-Up WILL CALL OFFICE PRN      Hansel Feinstein 03/03/2013, 11:23 AM

## 2013-03-03 NOTE — Telephone Encounter (Signed)
Completed and returned to [place]

## 2013-04-04 ENCOUNTER — Ambulatory Visit (INDEPENDENT_AMBULATORY_CARE_PROVIDER_SITE_OTHER): Payer: Managed Care, Other (non HMO) | Admitting: Sports Medicine

## 2013-04-04 VITALS — BP 118/72 | HR 95 | Temp 99.1°F | Ht 65.5 in | Wt 133.0 lb

## 2013-04-04 DIAGNOSIS — F17200 Nicotine dependence, unspecified, uncomplicated: Secondary | ICD-10-CM

## 2013-04-04 DIAGNOSIS — K219 Gastro-esophageal reflux disease without esophagitis: Secondary | ICD-10-CM

## 2013-04-04 MED ORDER — PANTOPRAZOLE SODIUM 40 MG PO TBEC: 40.0000 mg | DELAYED_RELEASE_TABLET | Freq: Every day | ORAL | Status: DC

## 2013-04-04 NOTE — Assessment & Plan Note (Signed)
Protonix X 3 months

## 2013-04-04 NOTE — Progress Notes (Signed)
Subjective:     Melissa Shelton is a 27 y.o. female who presents for a postpartum visit. She is 6 weeks postpartum following a spontaneous vaginal delivery. I have fully reviewed the prenatal and intrapartum course. The delivery was at [redacted]w[redacted]d gestational weeks. Outcome: spontaneous vaginal delivery. Anesthesia: epidural. Postpartum course has been uncomplated. Baby's course has been normal with exception of blood emesis resulting in hospitalization that was ultimately attributed to cracked nipples. Baby is feeding by bottle exclusively at this time.  Stopped breast feeding approximately 2 weeks ago. Bleeding no bleeding. Bowel function is normal. Bladder function is normal. Patient is not sexually active. Contraception method is tubal ligation. Postpartum depression screening: negative.  The patient is reporting some occasional reflux like symptoms.  She has not found any relief but is not taking any current medications.  Seems to be worse after spicy foods, or eating late at night.    Patient does continue to smoke.  She is wanting to go back to work on December 17  The following portions of the patient's history were reviewed and updated as appropriate: allergies, current medications, past family history, past medical history, past social history, past surgical history and problem list.  Review of Systems Pertinent items are noted in HPI.   Objective:    BP 118/72  Pulse 95  Temp(Src) 99.1 F (37.3 C) (Oral)  Ht 5' 5.5" (1.664 m)  Wt 133 lb (60.328 kg)  BMI 21.79 kg/m2   PHYSICAL EXAM: GENERAL:  adult Caucasian female. In no discomfort; no respiratory distress  PSYCH: alert and appropriate, good insight   HNEENT:   CARDIO: RRR, S1/S2 heard, no murmur  LUNGS: CTA B, no wheezes, no crackles  ABDOMEN: +BS, soft, non-tender, no rigidity, no guarding, no masses/hepatosplenomegaly; the umbilicus is well healed from her surgery, there is a small 2 mm scab at the base of her umbilicus.  There  is no erythema, draining or fluctuance   EXTREM:  Warm, well perfused.  Moves all 4 extremities spontaneously; no lateralization.  No pretibial edema.  GU:  deferred as no laceration repair and asymptomatic   SKIN:          Assessment:   Normal postpartum visit s/p tubal ligation No evidence of postpartum depression Tobacco abuse GERD Up-to-date on health maintenance     Plan:     Follow up in: When necessary or at age 31 for next Pap smear

## 2013-04-04 NOTE — Patient Instructions (Signed)
Try taking protonix 40mg  daily for 3 months QUIT SMOKING EXERCISE DAILY  Due for your next pap at your 29th birthday

## 2013-04-04 NOTE — Assessment & Plan Note (Signed)
QUIT! 

## 2013-05-15 ENCOUNTER — Encounter: Payer: Self-pay | Admitting: Sports Medicine

## 2013-05-15 ENCOUNTER — Ambulatory Visit (INDEPENDENT_AMBULATORY_CARE_PROVIDER_SITE_OTHER): Payer: Managed Care, Other (non HMO) | Admitting: Sports Medicine

## 2013-05-15 VITALS — BP 114/64 | HR 91 | Temp 99.1°F | Ht 65.5 in | Wt 136.0 lb

## 2013-05-15 DIAGNOSIS — F329 Major depressive disorder, single episode, unspecified: Secondary | ICD-10-CM

## 2013-05-15 DIAGNOSIS — F32A Depression, unspecified: Secondary | ICD-10-CM

## 2013-05-15 DIAGNOSIS — K219 Gastro-esophageal reflux disease without esophagitis: Secondary | ICD-10-CM

## 2013-05-15 DIAGNOSIS — F3289 Other specified depressive episodes: Secondary | ICD-10-CM

## 2013-05-15 DIAGNOSIS — O99340 Other mental disorders complicating pregnancy, unspecified trimester: Secondary | ICD-10-CM

## 2013-05-15 DIAGNOSIS — F17200 Nicotine dependence, unspecified, uncomplicated: Secondary | ICD-10-CM

## 2013-05-15 MED ORDER — OMEPRAZOLE 40 MG PO CPDR: 40.0000 mg | DELAYED_RELEASE_CAPSULE | Freq: Every day | ORAL | Status: DC

## 2013-05-15 MED ORDER — FLUOXETINE HCL 20 MG PO TABS: 20.0000 mg | ORAL_TABLET | Freq: Every day | ORAL | Status: DC

## 2013-05-15 MED ORDER — POLYETHYLENE GLYCOL 3350 17 GM/SCOOP PO POWD: 17.0000 g | Freq: Every day | ORAL | Status: AC

## 2013-05-15 NOTE — Patient Instructions (Signed)
   Change to Omeprazole for your reflux  Start Prozac daily.  Call in 1-2 weeks a leave a message to let me know how you are doing  I recommend that you meet with our psychologist, Dr. Zella Ball, for help dealing with your depression.  You can schedule an appointment with her by calling her directly at [phone].  Also I have given you the information [facility].   Bowel Clean out over the weekend then daily miralax.   Please mix 8 capfuls of Miralax into 32 oz of Gatorade.  Drink over 2 hours.  Then start taking 1-2 capful of Miralax daily to ensure you are having 1-2 soft BMs per day.  If more than 2 BMs cut back on the Miralax.  If you need anything prior to your next visit please call the clinic. Please Bring all medications or accurate medication list with you to each appointment; an accurate medication list is essential in providing you the best care possible.

## 2013-05-15 NOTE — Assessment & Plan Note (Signed)
Encouraged to quit. 

## 2013-05-15 NOTE — Assessment & Plan Note (Addendum)
Change to Omeprazole Bowel clean out with addition of daily bowel regimen Less likely gallbladder, maybe constipation.   Consider referral to GI vs [place].  ?Hemocult

## 2013-05-15 NOTE — Progress Notes (Signed)
  Melissa Shelton - 28 y.o. female MRN [id number]  Date of birth: 03-27-1986  CC, HPI, INTERVAL HISTORY & ROS  Melissa Shelton is here today for same day visit for Mood and Abdominal pain    She reports she has been under a lot of stress with her 69-month-old Wellsite geologist.    Reports continued feelings of depression, anhedonia, fatigue.  See below.    Additionally her abdominal pain has been chronic and unchanged since starting protonix.  Continues to have significant epigastric pain following eating spicy foods is described as a burning.  Does not seem to be associated with fatty and is described more as reflux instead of cramping/colicky pain.  She does occasionally strain with bowel movements.  Denies any vomiting, changes in her voice.    She does have some straining with bowel movements denies hemorrhoids, blood in her stools.  She continues to smoke.  History  Past Medical, Surgical, Social, and Family History Reviewed per EMR Medications and Allergies reviewed and all updated if necessary. Objective Findings  VITALS: HR: 91 bpm  BP: 114/64 mmHg  TEMP: 99.1 F (37.3 C) (Oral)  RESP:    HT: 5' 5.5" (166.4 cm)  WT: 136 lb (61.689 kg)  BMI: 22.3   BP Readings from Last 3 Encounters:  05/15/13 114/64  04/04/13 118/72  02/22/13 109/63   Wt Readings from Last 3 Encounters:  05/15/13 136 lb (61.689 kg)  04/04/13 133 lb (60.328 kg)  02/20/13 161 lb (73.029 kg)     PHYSICAL EXAM: GENERAL:  adult pale female. In no discomfort; no respiratory distress  PSYCH: alert and appropriate, good insight  PHQ-9 - Difficulty: Somewhat 1 2 3 4 5 6 7 8 9  Total:  3 3 2 2 1  0 3 2 0  15   MDQ - Extent of problem: Moderate 1 2 3 4 5 6 7 8 9 10 11 12 13  Total:  No Yes No Yes No Yes Yes No No No No Yes No 5/13   1+ symptom @ same time:   Yes Family History:  Yes - prevalent maternal side of family Personal History:  No  Denies SI/HI.  Requesting medications and willing for counseling.   HNEENT:  mild  posterior oropharyngeal erythema  No nasal exudate   CARDIO: RRR, S1/S2 heard, no murmur  LUNGS: CTA B, no wheezes, no crackles  ABDOMEN: +BS, soft, non-tender, no rigidity, no guarding, no hepatosplenomegaly.  Palpable stool burden on exam   EXTREM:    GU:   SKIN:     Assessment & Plan   Problems addressed today: General Plan & Pt Instructions:  1. Depression complicating pregnancy, antepartum   2. GERD (gastroesophageal reflux disease)       Change to Omeprazole for your reflux  Start Prozac daily.  Call in 1-2 weeks a leave a message to let me know how you are doing  I recommend that you meet with our psychologist, Dr. Zella Ball, for help dealing with your depression.  You can schedule an appointment with her by calling her directly at [phone].  Also I have given you the information [facility].   Bowel Clean out over the weekend then daily miralax.     For further discussion of A/P and for follow up issues see problem based charting if applicable.

## 2013-05-15 NOTE — Assessment & Plan Note (Addendum)
Start Prozac referall to Dr. Barrie [facility] [place] Extensively discussed [place] today and provided support. To call in 1-2 weeks to check in. F/u 1 month

## 2013-06-16 ENCOUNTER — Ambulatory Visit: Payer: Managed Care, Other (non HMO) | Admitting: Sports Medicine

## 2013-07-02 ENCOUNTER — Ambulatory Visit (INDEPENDENT_AMBULATORY_CARE_PROVIDER_SITE_OTHER): Payer: Managed Care, Other (non HMO) | Admitting: Sports Medicine

## 2013-07-02 ENCOUNTER — Encounter: Payer: Self-pay | Admitting: Sports Medicine

## 2013-07-02 VITALS — BP 108/50 | HR 71 | Temp 98.0°F | Ht 65.0 in | Wt 133.6 lb

## 2013-07-02 DIAGNOSIS — F329 Major depressive disorder, single episode, unspecified: Secondary | ICD-10-CM

## 2013-07-02 DIAGNOSIS — F3289 Other specified depressive episodes: Secondary | ICD-10-CM

## 2013-07-02 DIAGNOSIS — F17200 Nicotine dependence, unspecified, uncomplicated: Secondary | ICD-10-CM

## 2013-07-02 DIAGNOSIS — F53 Postpartum depression: Secondary | ICD-10-CM

## 2013-07-02 DIAGNOSIS — K219 Gastro-esophageal reflux disease without esophagitis: Secondary | ICD-10-CM

## 2013-07-02 DIAGNOSIS — O99345 Other mental disorders complicating the puerperium: Secondary | ICD-10-CM

## 2013-07-02 MED ORDER — FLUOXETINE HCL 20 MG PO TABS: 40.0000 mg | ORAL_TABLET | Freq: Every day | ORAL | Status: DC

## 2013-07-02 MED ORDER — ESOMEPRAZOLE MAGNESIUM 40 MG PO CPDR: 40.0000 mg | DELAYED_RELEASE_CAPSULE | Freq: Every day | ORAL | Status: DC

## 2013-07-02 NOTE — Patient Instructions (Signed)
   Increasing Prozac  Follow up with counseling as needed and try starting an exercise regimen.  Quit smoking, this will help her reflux.  Change reflux medicines, I have sent in a new prescription   If you need anything prior to your next visit please call the clinic. Please Bring all medications or accurate medication list with you to each appointment; an accurate medication list is essential in providing you the best care possible.

## 2013-07-02 NOTE — Assessment & Plan Note (Signed)
Problem Based Documentation:    Subjective Report:  Reports persistent heartburn symptoms.  Abdominal pain symptoms have improved.  Avoiding high ft and spicy foods.  Abdominal pain improved omeprazole but having persistent heartburn     Assessment & Plan & Follow up Issues:  Subacute condition, labile 1. Change PPI to as Nexium >

## 2013-07-02 NOTE — Assessment & Plan Note (Signed)
Problem Based Documentation:    Subjective Report:  .5-1pack per day     Assessment & Plan & Follow up Issues:  Chronic condition Contemplative 1. Encouraged to quit 2. Discussed interaction with GERD

## 2013-07-02 NOTE — Assessment & Plan Note (Signed)
Problem Based Documentation:    Subjective Report:  Reports significantly improved.  No side effects from medications.  Continues to significant stress at home although her roommate recently moved out.  Working Monday through Friday regular hours  Not involved in counseling     Assessment & Plan & Follow up Issues:  Subacute condition, improving 1. Increase Prozac to 40 mg > Followup mood, social situation

## 2013-07-02 NOTE — Progress Notes (Signed)
  Melissa Shelton - 28 y.o. female MRN [id number]  Date of birth: [birth year]  CC & Problems Addressed: General Plan & Pt Instructions:  Chief Complaint  Patient presents with  . Follow-up      Increasing Prozac  Follow up with counseling as needed and try starting an exercise regimen.  Quit smoking, this will help her reflux.  Change reflux medicines, I have sent in a new prescription     SUBJECTIVE:   She reports overall doing significantly better. For further subjective including (HPI, Interval History & ROS) please see problem based charting  HISTORY: No Patient Care Coordination Note on file. No results found for this basename: HGBA1C, TRIG, CHOL, HDL, LDLCALC, LDLDIRECT, TSH, T3FREE, FREET4, T3TOTAL, T4TOTAL, THYROIDAB,  in the last 8760 hours Wt Readings from Last 3 Encounters:  07/02/13 133 lb 9.6 oz (60.601 kg)  05/15/13 136 lb (61.689 kg)  04/04/13 133 lb (60.328 kg)   BP Readings from Last 3 Encounters:  07/02/13 108/50  05/15/13 114/64  04/04/13 118/72    History  Smoking status  . Current Every Day Smoker -- 0.50 packs/day for 8 years  . Types: Cigarettes  . Last Attempt to Quit: 07/10/2012  Smokeless tobacco  . Not on file    Comment: Quit cold [place] when found out she was pregnant   No health maintenance topics applied.  Otherwise past Medical, Surgical, Social, and Family History Reviewed per EMR Medications and Allergies reviewed and updated per below.  VITALS: Reviewed per EMR.   PHYSICAL EXAM: GENERAL:  adult Caucasian female. In no discomfort; no respiratory distress  PSYCH: alert and appropriate, good insight. Reports better control stress overall.      MEDICATIONS, LABS & OTHER ORDERS: Previous Medications   IBUPROFEN (ADVIL,MOTRIN) 600 MG TABLET    Take 1 tablet (600 mg total) by mouth every 6 (six) hours.   POLYETHYLENE GLYCOL POWDER (GLYCOLAX/MIRALAX) POWDER    Take 17 g by mouth daily.   PRENATAL VIT-FE FUMARATE-FA (PRENATAL  MULTIVITAMIN) TABS    Take 1 tablet by mouth daily at 12 noon.   Modified Medications   Modified Medication Previous Medication   FLUOXETINE (PROZAC) 20 MG TABLET FLUoxetine (PROZAC) 20 MG tablet      Take 2 tablets (40 mg total) by mouth daily.    Take 1 tablet (20 mg total) by mouth daily.   New Prescriptions   ESOMEPRAZOLE (NEXIUM) 40 MG CAPSULE    Take 1 capsule (40 mg total) by mouth daily.   Discontinued Medications   OMEPRAZOLE (PRILOSEC) 40 MG CAPSULE    Take 1 capsule (40 mg total) by mouth daily.  No orders of the defined types were placed in this encounter.    ASSESSMENT & PLAN:  See problem based charting & AVS for pt instructions.

## 2013-07-04 ENCOUNTER — Other Ambulatory Visit: Payer: Self-pay | Admitting: Sports Medicine

## 2013-07-04 MED ORDER — OMEPRAZOLE 40 MG PO CPDR: 40.0000 mg | DELAYED_RELEASE_CAPSULE | Freq: Two times a day (BID) | ORAL | Status: DC

## 2013-07-04 NOTE — Progress Notes (Signed)
Nexium not covered.  Omeprazole is on plan.  Will Rx for bid

## 2013-08-28 ENCOUNTER — Ambulatory Visit (INDEPENDENT_AMBULATORY_CARE_PROVIDER_SITE_OTHER): Payer: Managed Care, Other (non HMO) | Admitting: Sports Medicine

## 2013-08-28 ENCOUNTER — Encounter: Payer: Self-pay | Admitting: Sports Medicine

## 2013-08-28 VITALS — BP 101/49 | HR 69 | Temp 97.8°F | Ht 65.5 in | Wt 135.0 lb

## 2013-08-28 DIAGNOSIS — O99345 Other mental disorders complicating the puerperium: Secondary | ICD-10-CM

## 2013-08-28 DIAGNOSIS — L400 Psoriasis vulgaris: Secondary | ICD-10-CM

## 2013-08-28 DIAGNOSIS — F17200 Nicotine dependence, unspecified, uncomplicated: Secondary | ICD-10-CM

## 2013-08-28 DIAGNOSIS — F53 Postpartum depression: Secondary | ICD-10-CM

## 2013-08-28 DIAGNOSIS — F3289 Other specified depressive episodes: Secondary | ICD-10-CM

## 2013-08-28 DIAGNOSIS — R42 Dizziness and giddiness: Secondary | ICD-10-CM

## 2013-08-28 DIAGNOSIS — F329 Major depressive disorder, single episode, unspecified: Secondary | ICD-10-CM

## 2013-08-28 DIAGNOSIS — L408 Other psoriasis: Secondary | ICD-10-CM

## 2013-08-28 LAB — CBC
HEMATOCRIT: 36.5 % (ref 36.0–46.0)
Hemoglobin: 12.2 g/dL (ref 12.0–15.0)
MCH: 27.2 pg (ref 26.0–34.0)
MCHC: 33.4 g/dL (ref 30.0–36.0)
MCV: 81.5 fL (ref 78.0–100.0)
Platelets: 212 10*3/uL (ref 150–400)
RBC: 4.48 MIL/uL (ref 3.87–5.11)
RDW: 14.7 % (ref 11.5–15.5)
WBC: 7.5 10*3/uL (ref 4.0–10.5)

## 2013-08-28 MED ORDER — AQUAPHOR EX OINT: TOPICAL_OINTMENT | CUTANEOUS | Status: DC | PRN

## 2013-08-28 MED ORDER — BETAMETHASONE DIPROPIONATE 0.05 % EX OINT: TOPICAL_OINTMENT | Freq: Two times a day (BID) | CUTANEOUS | Status: DC

## 2013-08-28 MED ORDER — FLUOXETINE HCL 20 MG PO TABS: 20.0000 mg | ORAL_TABLET | Freq: Every day | ORAL | Status: DC

## 2013-08-28 NOTE — Progress Notes (Signed)
Melissa Shelton - 28 y.o. female MRN [id number]  Date of birth: Aug 29, 1985  CC: Dizziness and Psoriasis   SUBJECTIVE:     HPI Comments: Patient presents with:   Dizziness - for past 3 weeks, now daily.  with walking or standing; never laying.  worse when standing for long periods at work.   Psoriasis - worsening summer time  Reports her dizziness happens at intermittent times.  No consistent symptomatology.  Worse after missing meals are becoming dehydrated.  She does think this is potentially brought on by increasing her Prozac dose.  She reports overall her mood has been significantly better but she still has a significant amount of a controversy and her social life and high stress levels occasionally at home and work.  She denies any tachycardia palpitations, presyncope or chest pain.  Not worse with exertion.  She does also report her psoriasis seemed to worsen since the weather has changed.  She has not been using any specific ointment or creams.  She reports significant improvement with prior steroid cream but has been out of this.  No superficial infections erythema or pain. See problem based charting for additional problem specific subjective (including HPI, Interval History & ROS)   HISTORY: History  Smoking status  . Current Every Day Smoker -- 0.50 packs/day for 8 years  . Types: Cigarettes  . Last Attempt to Quit: 07/10/2012  Smokeless tobacco  . Not on file    Comment: Quit cold [place] when found out she was pregnant   Otherwise past Medical, Surgical, Social, and Family History Reviewed per EMR Medications and Allergies reviewed and updated per below.  OBJECTIVE: VITALS: BP: 101/49 mmHg  HR: 69 bpm  TEMP: 97.8 F (36.6 C) (Oral)  RESP:    HT: 5' 5.5" (166.4 cm)  WT: 135 lb (61.236 kg)  BMI: 22.2   BP Readings from Last 3 Encounters:  08/28/13 101/49  07/02/13 108/50  05/15/13 114/64    Wt Readings from Last 3 Encounters:  08/28/13 135 lb (61.236 kg)  07/02/13  133 lb 9.6 oz (60.601 kg)  05/15/13 136 lb (61.689 kg)     Physical Exam  Vitals reviewed. Constitutional: She is well-developed, well-nourished, and in no distress.  HENT:  Head: Normocephalic and atraumatic.  Right Ear: External ear normal.  Left Ear: External ear normal.  Neck: Normal range of motion. Neck supple. No tracheal deviation present. No thyromegaly present.  Cardiovascular: Normal rate, regular rhythm and normal heart sounds.  Exam reveals no gallop and no friction rub.   No murmur heard. Pulmonary/Chest: Effort normal and breath sounds normal. No respiratory distress. She has no wheezes. She has no rales.  Abdominal: Soft. Bowel sounds are normal. She exhibits no distension. There is no tenderness. There is no rebound.  Musculoskeletal: She exhibits no edema and no tenderness.  Lymphadenopathy:    She has no cervical adenopathy.  Neurological: She is alert.  Moves all 4 extremities spontaneously; no lateralization.  Skin: Skin is warm and dry. Rash (psoriatic skin scales on the posterior aspect of legs abdomen and back.  Reported as consistent with prior episodes) noted. She is not diaphoretic.  Psychiatric: Mood, memory, affect and judgment normal.   MEDICATIONS, LABS & OTHER ORDERS: Previous Medications   IBUPROFEN (ADVIL,MOTRIN) 600 MG TABLET    Take 1 tablet (600 mg total) by mouth every 6 (six) hours.   OMEPRAZOLE (PRILOSEC) 40 MG CAPSULE    Take 1 capsule (40 mg total) by mouth 2 (two)  times daily.   POLYETHYLENE GLYCOL POWDER (GLYCOLAX/MIRALAX) POWDER    Take 17 g by mouth daily.   PRENATAL VIT-FE FUMARATE-FA (PRENATAL MULTIVITAMIN) TABS    Take 1 tablet by mouth daily at 12 noon.   Modified Medications   Modified Medication Previous Medication   FLUOXETINE (PROZAC) 20 MG TABLET FLUoxetine (PROZAC) 20 MG tablet      Take 1 tablet (20 mg total) by mouth daily.    Take 2 tablets (40 mg total) by mouth daily.   New Prescriptions   BETAMETHASONE DIPROPIONATE  (DIPROLENE) 0.05 % OINTMENT    Apply topically 2 (two) times daily.   MINERAL OIL-HYDROPHILIC PETROLATUM (AQUAPHOR) OINTMENT    Apply topically as needed for dry skin.   Discontinued Medications   No medications on file   Orders Placed This Encounter  Procedures  . CBC  . Basic metabolic panel  . Vit D  25 hydroxy (rtn osteoporosis monitoring)   ASSESSMENT & PLAN: See problem based charting & AVS for pt instructions.

## 2013-08-28 NOTE — Patient Instructions (Signed)
Stop Prozac for 3 days.  Then restart 20mg  (1tab) daily. Drink plenty of fluids; weaning compression.  Aquafor daily.  Keep using dove.  Refill steroid ointment.  If not improved we will have you see dermatology again.  Checking labs today

## 2013-08-29 LAB — BASIC METABOLIC PANEL
BUN: 9 mg/dL (ref 6–23)
CALCIUM: 9.6 mg/dL (ref 8.4–10.5)
CO2: 29 mEq/L (ref 19–32)
CREATININE: 0.56 mg/dL (ref 0.50–1.10)
Chloride: 104 mEq/L (ref 96–112)
Glucose, Bld: 92 mg/dL (ref 70–99)
POTASSIUM: 3.8 meq/L (ref 3.5–5.3)
Sodium: 140 mEq/L (ref 135–145)

## 2013-08-29 LAB — VITAMIN D 25 HYDROXY (VIT D DEFICIENCY, FRACTURES): VIT D 25 HYDROXY: 29 ng/mL — AB (ref 30–89)

## 2013-08-31 NOTE — Assessment & Plan Note (Addendum)
Chronic condition - Significantly improved however with worrisome symptoms concerning for overmedication - No evidence of cardiogenic etiology on exam or from history. 1. Decrease Prozac back to 20 mg after holding for 3 days for likely dizziness associated to medication   2. Will check some basic labs however likely secondary to above >  Discuss if continuation of medication as appropriate followup.  Has been greater than 3 months and could try off of medicine now that depression symptoms are improved

## 2013-08-31 NOTE — Assessment & Plan Note (Signed)
Chronic, worsening condition.  Previously seen by dermatology and treated with topical medications only. - Worsening psoriatic skin lesions 1. We prescribe hypodensity steroid for occasional use. 2. Discussed appropriate moisturizing regimen for secondary eczematous effects. 3. Discussed if not improving or continuing to worsen should be seen again by dermatology for consideration of immunomodulation given potential arthritic involvement and systemic process.  Patient is agreeable and would like to wait on referral at this time > Followup lesions and consider referral to dermatology  .

## 2013-08-31 NOTE — Assessment & Plan Note (Signed)
Problem Based Documentation:    Subjective Report:  Half pack per day     Assessment & Plan & Follow up Issues:  Chronic condition Contemplative 1. Encouraged to quit >

## 2013-09-03 ENCOUNTER — Encounter: Payer: Self-pay | Admitting: Sports Medicine

## 2013-11-17 ENCOUNTER — Ambulatory Visit (HOSPITAL_COMMUNITY)
Admission: RE | Admit: 2013-11-17 | Discharge: 2013-11-17 | Disposition: A | Discharge status: Home / Self Care | Payer: Managed Care, Other (non HMO) | Source: Ambulatory Visit | Attending: Family Medicine | Admitting: Family Medicine

## 2013-11-17 ENCOUNTER — Telehealth: Payer: Self-pay | Admitting: Family Medicine

## 2013-11-17 ENCOUNTER — Encounter: Payer: Self-pay | Admitting: Family Medicine

## 2013-11-17 ENCOUNTER — Ambulatory Visit (INDEPENDENT_AMBULATORY_CARE_PROVIDER_SITE_OTHER): Payer: Managed Care, Other (non HMO) | Admitting: Family Medicine

## 2013-11-17 VITALS — BP 121/85 | HR 68 | Temp 98.3°F | Wt 138.0 lb

## 2013-11-17 DIAGNOSIS — IMO0002 Reserved for concepts with insufficient information to code with codable children: Secondary | ICD-10-CM

## 2013-11-17 DIAGNOSIS — S9782XS Crushing injury of left foot, sequela: Secondary | ICD-10-CM

## 2013-11-17 DIAGNOSIS — M79609 Pain in unspecified limb: Secondary | ICD-10-CM | POA: Insufficient documentation

## 2013-11-17 DIAGNOSIS — S9780XA Crushing injury of unspecified foot, initial encounter: Secondary | ICD-10-CM | POA: Insufficient documentation

## 2013-11-17 DIAGNOSIS — S92912A Unspecified fracture of left toe(s), initial encounter for closed fracture: Secondary | ICD-10-CM

## 2013-11-17 DIAGNOSIS — S92919A Unspecified fracture of unspecified toe(s), initial encounter for closed fracture: Secondary | ICD-10-CM

## 2013-11-17 DIAGNOSIS — X58XXXA Exposure to other specified factors, initial encounter: Secondary | ICD-10-CM | POA: Insufficient documentation

## 2013-11-17 NOTE — Patient Instructions (Signed)
Ms Connon it was great to meet you today!  I am sorry to hear about your foot. For pain control please try ibuprofen or tylenol, which you can take every 4-6 hours as needed for discomfort Also try icing to bring down the swelling Try to stay off your foot as much as you are able to tolerate  You may walk across towards the hospital to get an xray at your convenience I will call you with the results  Looking forward to seeing you soon Bernadene Bell, MD

## 2013-11-17 NOTE — Progress Notes (Signed)
Patient ID: [name], female   DOB: 10-02-1985, 28 y.o.   MRN: [id number]   [facility] Bernadene Bell, MD Phone: [phone]  Subjective:  Melissa Shelton is a 28 y.o F who presents today for SDA for foot issues   # foot pain -yesterday pt was trying to keep door from being closed and was wearing flip flops, door then released -toes got bent backwards; no falls or further injury -since that time has been limping on her foot -has not tried tylenol, icing or elevation  -having difficulty bearing weight on her leg at this time 2/2 discomfort   All relevant systems were reviewed and were negative unless otherwise noted in the HPI  Past Medical History Patient Active Problem List   Diagnosis Date Noted  . GERD (gastroesophageal reflux disease) 04/04/2013  . Poor dentition 10/23/2012  . Chronic venous insufficiency 09/25/2012  . 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 Yeast vaginitis 08/02/2012  . 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 TOBACCO USER 05/03/2009  . ALLERGIC CONJUNCTIVITIS 06/28/2006  . RHINITIS, ALLERGIC 06/28/2006  . Dysplasia of cervix (uteri) 06/28/2006   Reviewed problem list.  Medications- reviewed and updated Chief complaint-noted No additions to family history Social history- patient is a current every day smoker  Objective: BP 121/85  Pulse 68  Temp(Src) 98.3 F (36.8 C) (Oral)  Wt 138 lb (62.596 kg)  LMP 11/17/2013  Breastfeeding? No Gen: NAD, alert, cooperative with exam Ext: No edema, warm, normal tone DP1-2+ bilaterally; left forefoot with tenderness over the 3rd-5th toes at the ip and mp; limited flexion and extension at these digits when compared with the right Neuro: Alert and oriented, neurologically intact  Skin: bruising noted over the forefoot  Assessment/Plan: See problem based a/p

## 2013-11-17 NOTE — Assessment & Plan Note (Signed)
Traumatic; limited weight bearing No neurological or vascular deficits seen at this time Sent for XR, no fracture noted Pt instructed to elevate foot, ice, ibuprofen/tylenol Boyfriend has crutches that she will be using in the meanwhile Will try to stay off her foot as much as possible If not improved in 1 weeks time could consider additional imaging for ligamenteous/tendonous tears

## 2013-11-17 NOTE — Telephone Encounter (Signed)
Called to let pt know neg xray [facility], MD

## 2013-12-04 ENCOUNTER — Encounter: Payer: Self-pay | Admitting: Family Medicine

## 2013-12-04 ENCOUNTER — Other Ambulatory Visit (HOSPITAL_COMMUNITY)
Admission: RE | Admit: 2013-12-04 | Discharge: 2013-12-04 | Disposition: A | Discharge status: Home / Self Care | Payer: Managed Care, Other (non HMO) | Source: Ambulatory Visit | Attending: Family Medicine | Admitting: Family Medicine

## 2013-12-04 ENCOUNTER — Ambulatory Visit (INDEPENDENT_AMBULATORY_CARE_PROVIDER_SITE_OTHER): Payer: Managed Care, Other (non HMO) | Admitting: Family Medicine

## 2013-12-04 VITALS — BP 131/88 | HR 87 | Temp 98.1°F | Wt 136.0 lb

## 2013-12-04 DIAGNOSIS — Z113 Encounter for screening for infections with a predominantly sexual mode of transmission: Secondary | ICD-10-CM | POA: Insufficient documentation

## 2013-12-04 DIAGNOSIS — R300 Dysuria: Secondary | ICD-10-CM

## 2013-12-04 DIAGNOSIS — Z202 Contact with and (suspected) exposure to infections with a predominantly sexual mode of transmission: Secondary | ICD-10-CM

## 2013-12-04 DIAGNOSIS — N898 Other specified noninflammatory disorders of vagina: Secondary | ICD-10-CM

## 2013-12-04 LAB — POCT UA - MICROSCOPIC ONLY

## 2013-12-04 LAB — POCT URINALYSIS DIPSTICK
BILIRUBIN UA: NEGATIVE
Glucose, UA: NEGATIVE
Ketones, UA: NEGATIVE
NITRITE UA: POSITIVE
PH UA: 6
PROTEIN UA: 100
Spec Grav, UA: >=1.03
Urobilinogen, UA: 0.2

## 2013-12-04 LAB — POCT WET PREP (WET MOUNT): CLUE CELLS WET PREP WHIFF POC: POSITIVE

## 2013-12-04 MED ORDER — METRONIDAZOLE 500 MG PO TABS: 500.0000 mg | ORAL_TABLET | Freq: Two times a day (BID) | ORAL | Status: DC

## 2013-12-04 MED ORDER — CEPHALEXIN 500 MG PO CAPS: 500.0000 mg | ORAL_CAPSULE | Freq: Four times a day (QID) | ORAL | Status: DC

## 2013-12-04 MED ORDER — FLUCONAZOLE 150 MG PO TABS: 150.0000 mg | ORAL_TABLET | Freq: Once | ORAL | Status: DC

## 2013-12-04 NOTE — Patient Instructions (Addendum)
Urinary Tract Infection Urinary tract infections (UTIs) can develop anywhere along your urinary tract. Your urinary tract is your body's drainage system for removing wastes and extra water. Your urinary tract includes two kidneys, two ureters, a bladder, and a urethra. Your kidneys are a pair of bean-shaped organs. Each kidney is about the size of your fist. They are located below your ribs, one on each side of your spine. CAUSES Infections are caused by microbes, which are microscopic organisms, including fungi, viruses, and bacteria. These organisms are so small that they can only be seen through a microscope. Bacteria are the microbes that most commonly cause UTIs. SYMPTOMS  Symptoms of UTIs may vary by age and gender of the patient and by the location of the infection. Symptoms in young women typically include a frequent and intense urge to urinate and a painful, burning feeling in the bladder or urethra during urination. Older women and men are more likely to be tired, shaky, and weak and have muscle aches and abdominal pain. A fever may mean the infection is in your kidneys. Other symptoms of a kidney infection include pain in your back or sides below the ribs, nausea, and vomiting. DIAGNOSIS To diagnose a UTI, your caregiver will ask you about your symptoms. Your caregiver also will ask to provide a urine sample. The urine sample will be tested for bacteria and white blood cells. White blood cells are made by your body to help fight infection. TREATMENT  Typically, UTIs can be treated with medication. Because most UTIs are caused by a bacterial infection, they usually can be treated with the use of antibiotics. The choice of antibiotic and length of treatment depend on your symptoms and the type of bacteria causing your infection. HOME CARE INSTRUCTIONS  If you were prescribed antibiotics, take them exactly as your caregiver instructs you. Finish the medication even if you feel better after you  have only taken some of the medication.  Drink enough water and fluids to keep your urine clear or pale yellow.  Avoid caffeine, tea, and carbonated beverages. They tend to irritate your bladder.  Empty your bladder often. Avoid holding urine for long periods of time.  Empty your bladder before and after sexual intercourse.  After a bowel movement, women should cleanse from front to back. Use each tissue only once. SEEK MEDICAL CARE IF:   You have back pain.  You develop a fever.  Your symptoms do not begin to resolve within 3 days. SEEK IMMEDIATE MEDICAL CARE IF:   You have severe back pain or lower abdominal pain.  You develop chills.  You have nausea or vomiting.  You have continued burning or discomfort with urination. MAKE SURE YOU:   Understand these instructions.  Will watch your condition.  Will get help right away if you are not doing well or get worse. Document Released: 01/25/2005 Document Revised: 10/17/2011 Document Reviewed: 05/26/2011 [facility] Patient Information 2015 [place], [place]. This information is not intended to replace advice given to you by your health care provider. Make sure you discuss any questions you have with your health care provider.  You do have a urinary tract infection today. I have called in an antibiotic called Keflex for you take 4 times a day for 7 days. He is complete these antibiotics as prescribed or your urinary tract infection will return. In addition you do have a bacteria infection, which is not a sexually transmitted disease, and a yeast infection. I have prescribed Diflucan and Flagyl to  cover these conditions. I will call you with the results of your lab work.

## 2013-12-04 NOTE — Assessment & Plan Note (Signed)
HIV, hepatitis and RPR obtained today. Will call patient with results

## 2013-12-04 NOTE — Addendum Note (Signed)
Addended by: Howard Pouch A on: 12/04/2013 04:16 PM   Modules accepted: Orders

## 2013-12-04 NOTE — Assessment & Plan Note (Addendum)
UA: Positive leuks and nitrites,  Will send for urine culture Treat with Keflex 4 times a day for 7 days. Red flags discussed Followup when necessary

## 2013-12-04 NOTE — Assessment & Plan Note (Addendum)
Wet prep and GC collected today Positive clue and yeast>> Diflucan and Flagyl prescribed Will call patient with results and treat per results

## 2013-12-04 NOTE — Progress Notes (Signed)
   Subjective:    Patient ID: Melissa Shelton, female    DOB: 20-Jun-1985, 28 y.o.   MRN: [id number]  HPI Patient presents to family medicine clinic for same-day appointment  Dysuria: Patient states she noticed 3 days ago she was unable to get the sensation of completely emptying her bladder. Since she has had frequency in urination and dysuria. Patient states she is having pain pointing to her lower abdomen. She denies chills, fevers, lower back pain. Patient is eating and drinking appropriately.  Vaginal discharge: Patient states she's noticed a small vaginal discharge and irritation over the last week. She has concerns as exposure to safely transmitted diseases. She states she has been monogamous with one female partner for years, however he he is recently found cheating.    Review of Systems Per history of present illness    Objective:   Physical Exam BP 131/88  Pulse 87  Temp(Src) 98.1 F (36.7 C) (Oral)  Wt 136 lb (61.689 kg)  LMP 11/17/2013 Gen: Pleasant, Caucasian female, no acute distress, nontoxic in appearance. Well-developed, well-nourished HEENT: AT. [place].Bilateral eyes without injections or icterus. MMM.  Abd: Soft.suprapubic tenderness, ND. BS present. No Masses palpated.  Skin: No rashes, purpura or petechiae, other than her psoriasis.   GYN:  External genitalia within normal limits.  Vaginal mucosa pink, moist, normal rugae.  Nonfriable cervix without lesions, mild white discharge, no bleeding noted on speculum exam.  Mild odor. Bimanual exam revealed normal, nongravid uterus.  No cervical motion tenderness. No adnexal masses bilaterally.          Assessment & Plan:

## 2013-12-05 ENCOUNTER — Telehealth: Payer: Self-pay | Admitting: Family Medicine

## 2013-12-05 LAB — RPR

## 2013-12-05 LAB — HIV ANTIBODY (ROUTINE TESTING W REFLEX): HIV 1&2 Ab, 4th Generation: NONREACTIVE

## 2013-12-05 LAB — HEPATITIS PANEL, ACUTE
HCV Ab: NEGATIVE
HEP A IGM: NONREACTIVE
Hep B C IgM: NONREACTIVE
Hepatitis B Surface Ag: NEGATIVE

## 2013-12-05 NOTE — Telephone Encounter (Signed)
Please call Melissa Shelton and inform her that her STD panel is negative. Everything is normal, no signs of infection, except in her urine, yeast and BV that she is already being treated. Thanks.

## 2013-12-05 NOTE — Telephone Encounter (Signed)
Patient informed. 

## 2013-12-05 NOTE — Telephone Encounter (Signed)
LVM for patient to call back. ?

## 2013-12-06 LAB — URINE CULTURE: Colony Count: >=100000

## 2013-12-08 ENCOUNTER — Telehealth: Payer: Self-pay | Admitting: Family Medicine

## 2013-12-08 NOTE — Telephone Encounter (Signed)
Left detailed message on patients voicemail. Emberlin Verner S  

## 2013-12-08 NOTE — Telephone Encounter (Signed)
Please call Melissa Shelton and inform her that her urune culture is Growing E.Coli. This is the most common bacteria that causes infections in the urine. She should continue the antibiotics we gave her to completion. Thanks.

## 2013-12-17 IMAGING — US US OB DETAIL+14 WK
1 series · 12 of 28 positions shown · non-contrast
Comparison: none

[Series 1: us ob detail +14 wk · 84 acquisitions, 12 frames shown]
[im 4/84]
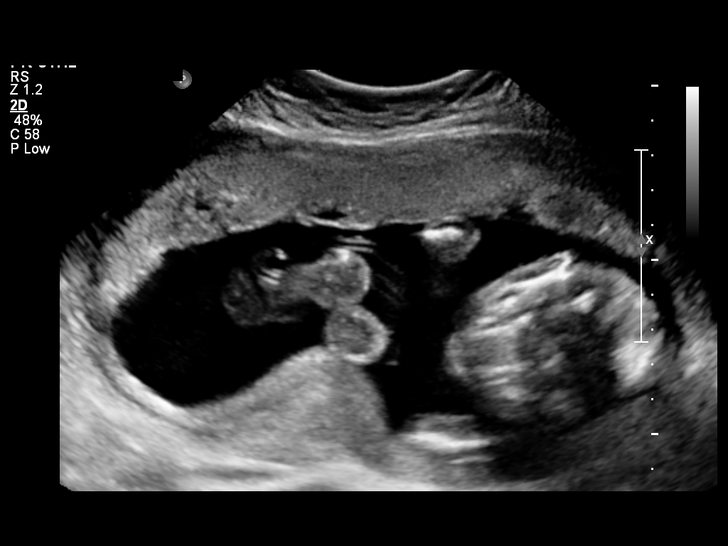
[im 10/84]
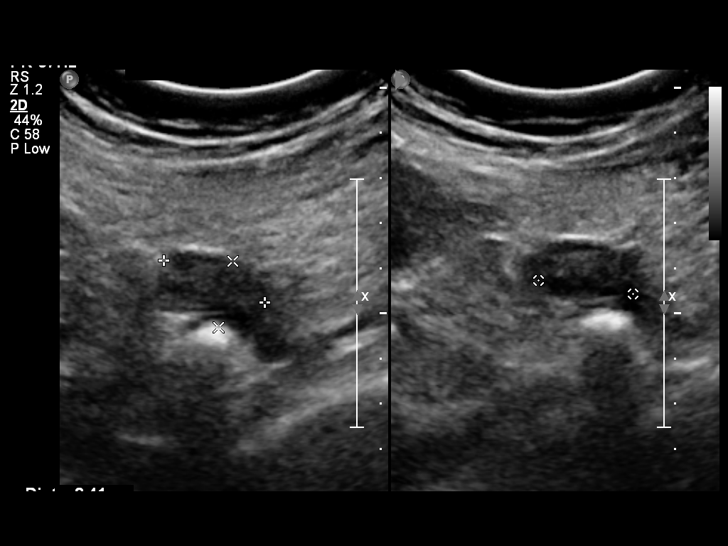
[im 16/84]
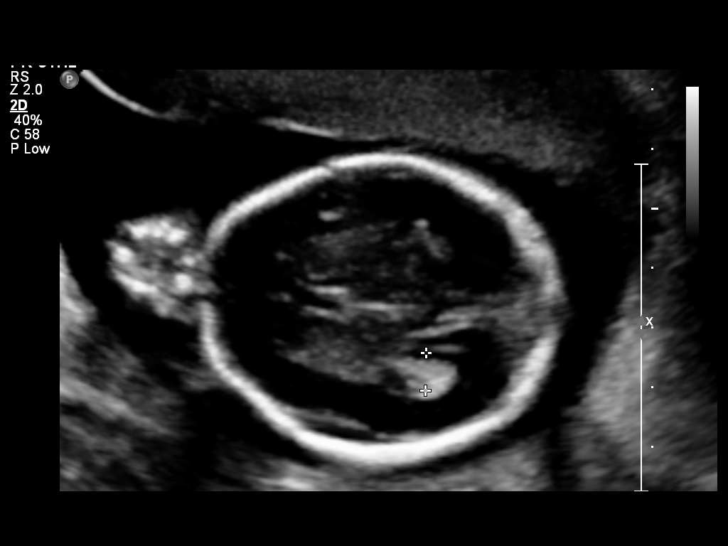
[im 25/84]
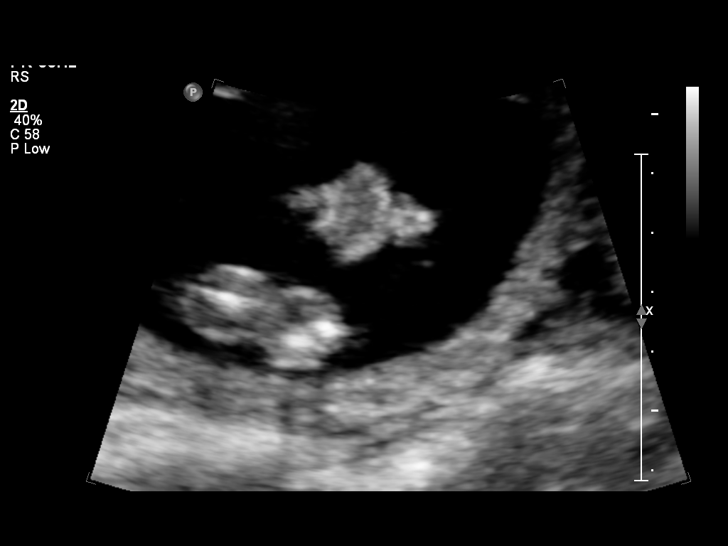
[im 31/84]
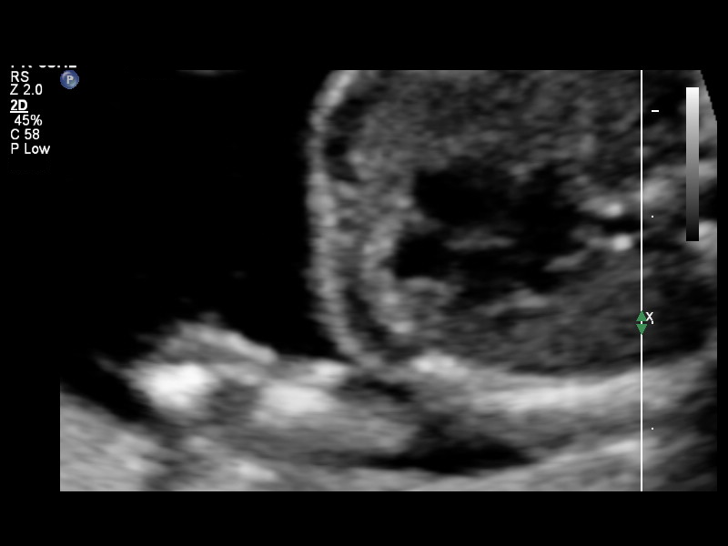
[im 37/84]
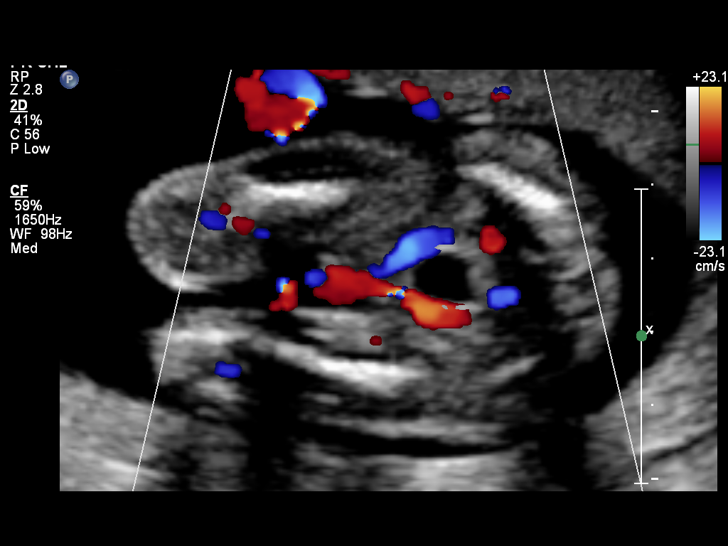
[im 47/84]
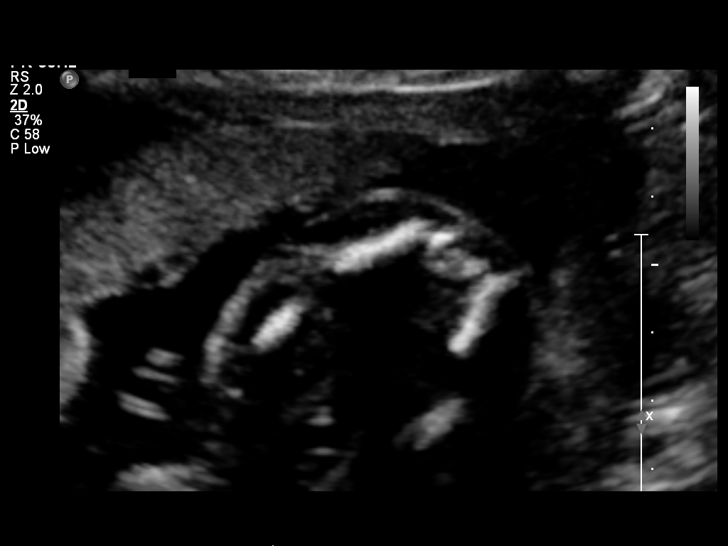
[im 53/84]
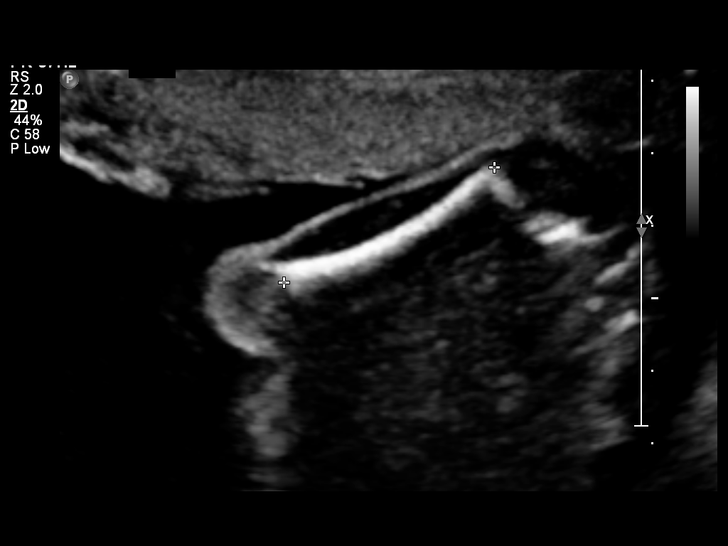
[im 59/84]
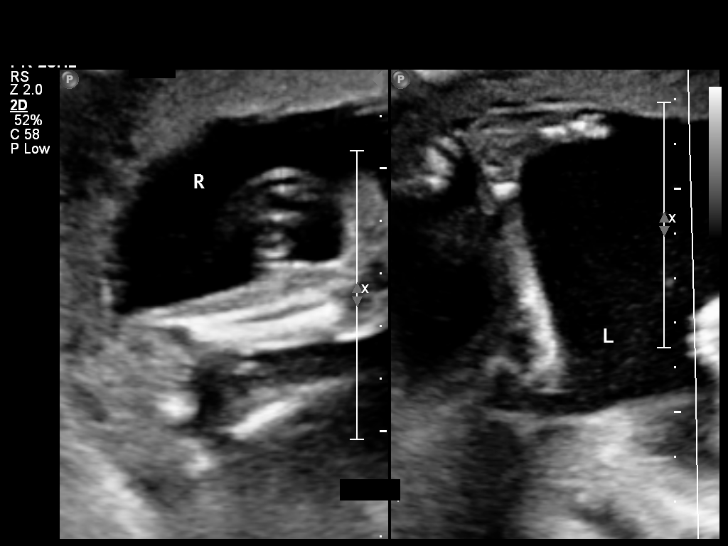
[im 68/84]
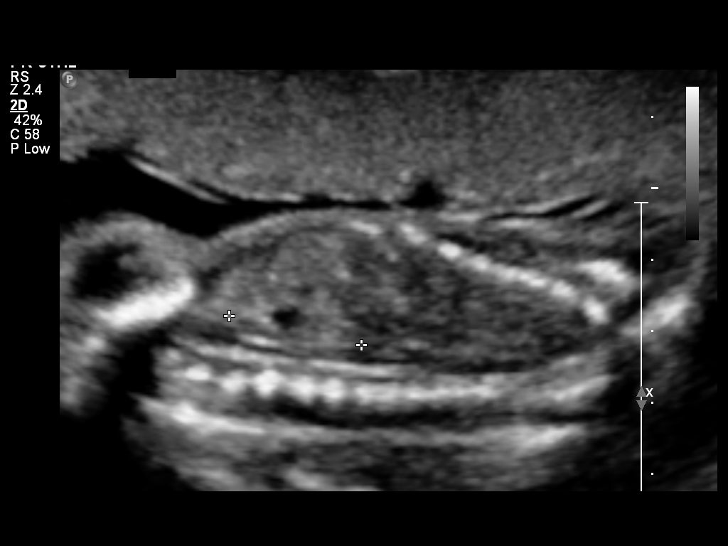
[im 74/84]
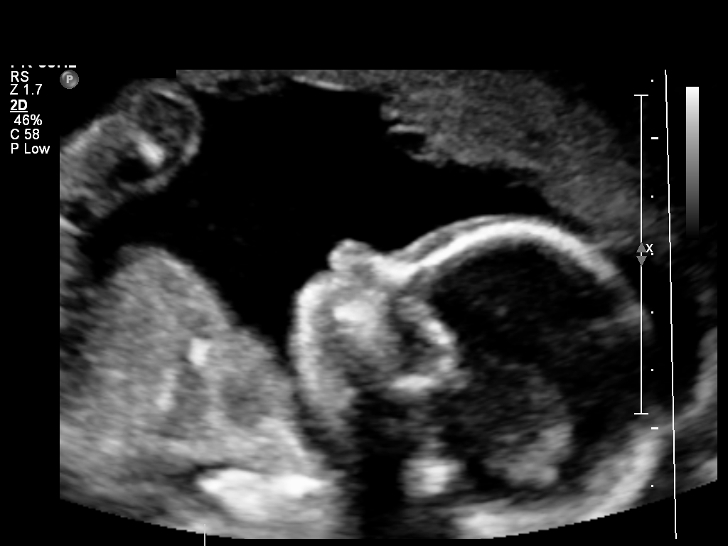
[im 80/84]
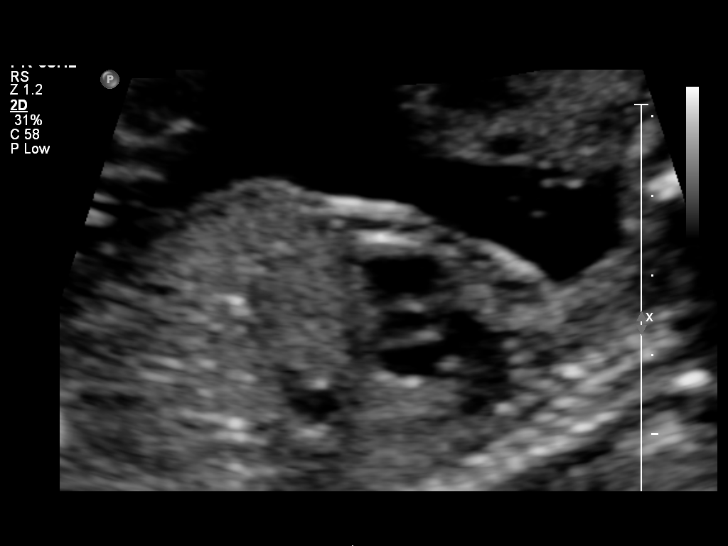

[12 of 28 positions shown; findings below may reference images not displayed]

OBSTETRICS REPORT
                      (Signed Final 10/17/2012 [DATE])

Service(s) Provided

 US OB DETAIL + 14 WK                                  76811.0
Indications

 Detailed fetal anatomic survey
Fetal Evaluation

 Num Of Fetuses:    1
 Fetal Heart Rate:  147                         bpm
 Cardiac Activity:  Observed
 Presentation:      Cephalic
 Placenta:          Anterior, above cervical os
 P. Cord            Visualized, central
 Insertion:

 Amniotic Fluid
 AFI FV:      Subjectively within normal limits
                                             Larg Pckt:   6.22   cm
Biometry

 BPD:     49.3  mm    G. Age:   20w 6d                CI:        77.87   70 - 86
                                                      FL/HC:      19.1   16.8 -

 HC:     176.8  mm    G. Age:   20w 1d       37  %    HC/AC:      1.16   1.09 -

 AC:       153  mm    G. Age:   20w 4d       51  %    FL/BPD:
 FL:      33.8  mm    G. Age:   20w 4d       53  %    FL/AC:      22.1   20 - 24
 HUM:     32.1  mm    G. Age:   20w 5d       64  %
 CER:     20.8  mm    G. Age:   19w 6d       39  %

 Est. FW:     360  gm    0 lb 13 oz      52  %
Gestational Age

 LMP:           20w 2d       Date:   05/28/12                 EDD:   03/04/13
 U/S Today:     20w 4d                                        EDD:   03/02/13
 Best:          20w 2d    Det. By:   LMP  (05/28/12)          EDD:   03/04/13
2nd Trimester Genetic Sonogram - Trisomy 21 Screening

 Age:                                             27          Risk=1:   719

 Structural anomalies (inc. cardiac):             No
 Echogenic bowel:                                 No
 Hypoplastic / absent midphalanx 5th Digit:       No
 Wide space 9st-5nd toes:                         N/A
 Pyelectasis:                                     No
 2-vessel umbilical cord:                         No
 Echogenic cardiac foci:                          No
Anatomy

 Cranium:          Appears normal         Aortic Arch:      Appears normal
 Fetal Cavum:      Appears normal         Ductal Arch:      Appears normal
 Ventricles:       Appears normal         Diaphragm:        Appears normal
 Choroid Plexus:   Appears normal         Stomach:          Appears normal, left
                                                            sided
 Cerebellum:       Appears normal         Abdomen:          Appears normal
 Posterior Fossa:  Appears normal         Abdominal Wall:   Appears nml (cord
                                                            insert, abd wall)
 Nuchal Fold:      Not applicable (>20    Cord Vessels:     Appears normal (3
                   wks GA)                                  vessel cord)
 Face:             Appears normal         Kidneys:          Appear normal
                   (orbits and profile)
 Lips:             Appears normal         Bladder:          Appears normal
 Heart:            Appears normal         Spine:            Appears normal
                   (4CH, axis, and
                   situs)
 RVOT:             Appears normal         Lower             Appears normal
                                          Extremities:
 LVOT:             Appears normal         Upper             Appears normal
                                          Extremities:

 Other:  Fetus appears to be a female. Heels and 5th digit visualized. Nasal
         bone visualized. Technically difficult due to fetal position.
Targeted Anatomy

 Fetal Central Nervous System
 Lat. Ventricles:  6.3                    Cisterna Magna:
Cervix Uterus Adnexa

 Cervical Length:   3.58      cm

 Cervix:       Normal appearance by transabdominal scan.
 Left Ovary:   Within normal limits.
 Right Ovary:  Within normal limits.

 Adnexa:     No abnormality visualized.
Impression

 Siup demonstrating an EGA by ultrasound of 20w 4d. This
 corresponds well with expected EGA by LMP of 20w 2d.

 No focal fetal or placental abnormalities are noted with a
 good anatomic evaluation possible. No soft markers for Down
 Syndrome are seen. Correlation with other aneuploidy
 screening results, if available, would be recommended for a
 more complete risk assessment.

 Subjectively and quantitatively normal amniotic fluid volume.
 Normal cervical length.
 questions or concerns.

## 2014-01-15 ENCOUNTER — Ambulatory Visit (INDEPENDENT_AMBULATORY_CARE_PROVIDER_SITE_OTHER): Payer: Managed Care, Other (non HMO) | Admitting: Family Medicine

## 2014-01-15 ENCOUNTER — Encounter: Payer: Self-pay | Admitting: Family Medicine

## 2014-01-15 VITALS — BP 107/61 | HR 75 | Temp 98.0°F | Ht 64.5 in | Wt 130.0 lb

## 2014-01-15 DIAGNOSIS — R21 Rash and other nonspecific skin eruption: Secondary | ICD-10-CM | POA: Insufficient documentation

## 2014-01-15 DIAGNOSIS — L408 Other psoriasis: Secondary | ICD-10-CM

## 2014-01-15 DIAGNOSIS — L400 Psoriasis vulgaris: Secondary | ICD-10-CM

## 2014-01-15 MED ORDER — CLOBETASOL PROPIONATE 0.05 % EX CREA: 1.0000 "application " | TOPICAL_CREAM | Freq: Two times a day (BID) | CUTANEOUS | Status: DC

## 2014-01-15 MED ORDER — MUPIROCIN 2 % EX OINT: 1.0000 "application " | TOPICAL_OINTMENT | Freq: Two times a day (BID) | CUTANEOUS | Status: DC

## 2014-01-15 NOTE — Patient Instructions (Signed)
I have called in Bactroban ointment to be applied on the affected area daily for at least 7 days or until healed I have also called shoe in her refill on the clobetasol for you to use your psoriasis. I encourage you to make an appointment with her dermatologist to discuss additional medications that are available to you to help control your psoriasis. Please make an appointment to be seen in 2 weeks if the rash has not completely resolved. Please come in immediately if you notice any drainage, pus like or redness around her buttock.

## 2014-01-15 NOTE — Assessment & Plan Note (Addendum)
Rash of uncertain etiology. Does not appear to be an extension of her psoriasis. She has excoriated the rash, is mildly erythemic no drainage noted. Will treat with Bactroban ointment for now, return in 2 weeks if not completely resolved Red flags discussed for patient to return to clinic if she notices puslike drainage or redness of the buttock, cannot rule out rash may have been vesicular in nature ? herpatic

## 2014-01-15 NOTE — Progress Notes (Signed)
   Subjective:    Patient ID: Melissa Shelton, female    DOB: [birth year], 28 y.o.   MRN: [id number]  HPI Melissa Shelton is a 28 y.o. female presents to SDA  Right-sided buttock rash: Patient states she's noticed a rash on the right side of her buttocks, approximately one week ago. She states initially it was itchy, however that has stopped. She has been scratching at the area. She denies any fever, nausea vomiting or diarrhea. She denies any drainage from the rash. She has not had a rash like this prior. She states the rash is not painful. She is on her menses today. She has not changed any sexual partners. She does have a history of psoriasis, she is not on any medications for this currently.   Review of Systems Per history of present illness    Objective:   Physical Exam BP 107/61  Pulse 75  Temp(Src) 98 F (36.7 C) (Oral)  Ht 5' 4.5" (1.638 m)  Wt 130 lb (58.968 kg)  BMI 21.98 kg/m2 Gen: Pleasant, Caucasian female, no acute distress, nontoxic in appearance, well-developed, well-nourished. HEENT: AT. [place].MMM. Skin: No purpura or petechiae. Patient has mildly erythemic rash of right buttock. It appears that rash has been excoriated, no drainage noted. Does not appear to be consistent with psoriasis. Not plaque-like. Possibly vesicular? Prior to excoriation.    Assessment & Plan:

## 2014-01-15 NOTE — Assessment & Plan Note (Signed)
Refill on clobetasol. She states this cream has worked better for her in another steroid creams prescribed her in the past. Encouraged her to followup with her dermatologist.

## 2014-01-23 ENCOUNTER — Other Ambulatory Visit: Payer: Self-pay | Admitting: *Deleted

## 2014-01-23 DIAGNOSIS — R21 Rash and other nonspecific skin eruption: Secondary | ICD-10-CM

## 2014-01-23 DIAGNOSIS — L400 Psoriasis vulgaris: Secondary | ICD-10-CM

## 2014-01-27 ENCOUNTER — Telehealth: Payer: Self-pay | Admitting: Family Medicine

## 2014-01-27 NOTE — Telephone Encounter (Signed)
Patient was to follow up with PCP. Will need to evaluate her to see need of continued creams. Thanks.

## 2014-01-27 NOTE — Telephone Encounter (Signed)
2nd request for medications.  Derl Barrow, RN

## 2014-01-28 NOTE — Telephone Encounter (Signed)
[name] informing pt that "appointment with Dr. Lonny Prude would need to happen before creams could be filled" Melissa Shelton, [name]

## 2014-03-02 ENCOUNTER — Encounter: Payer: Self-pay | Admitting: Family Medicine

## 2014-04-08 ENCOUNTER — Encounter: Payer: Self-pay | Admitting: Family Medicine

## 2014-04-08 ENCOUNTER — Ambulatory Visit (INDEPENDENT_AMBULATORY_CARE_PROVIDER_SITE_OTHER): Payer: Managed Care, Other (non HMO) | Admitting: Family Medicine

## 2014-04-08 VITALS — BP 120/77 | HR 93 | Temp 98.0°F | Ht 64.5 in | Wt 128.0 lb

## 2014-04-08 DIAGNOSIS — R300 Dysuria: Secondary | ICD-10-CM

## 2014-04-08 LAB — POCT UA - MICROSCOPIC ONLY

## 2014-04-08 LAB — POCT URINALYSIS DIPSTICK
Bilirubin, UA: NEGATIVE
Glucose, UA: NEGATIVE
Ketones, UA: NEGATIVE
LEUKOCYTES UA: NEGATIVE
Nitrite, UA: NEGATIVE
PH UA: 5.5
PROTEIN UA: 100
Spec Grav, UA: >=1.03
UROBILINOGEN UA: 0.2

## 2014-04-08 MED ORDER — CEPHALEXIN 500 MG PO CAPS: 500.0000 mg | ORAL_CAPSULE | Freq: Four times a day (QID) | ORAL | Status: DC

## 2014-04-08 NOTE — Assessment & Plan Note (Addendum)
UA today: With blood and bacteria. Patient is on her menstrual cycle. Positive for calcium oxalate crystals. Sent for culture. We will call patient with results after culture is available. Kelflex 4 times a day for 7 days Follow-up as needed

## 2014-04-08 NOTE — Patient Instructions (Signed)

## 2014-04-08 NOTE — Progress Notes (Signed)
   Subjective:    Patient ID: Melissa Shelton, female    DOB: 1985-08-05, 28 y.o.   MRN: [id number]  HPI  Dysuria: Patient presents with 2 day history of increased urination frequency and burning with urination. Patient had a urinary tract infection in August of this year, with similar symptoms. At that time her urine culture grew out Escherichia coli. She responded well to Keflex. She denies any fevers, chills, nausea or vomiting. She denies any back pain Or abdominal pain. Patient is currently on her menstrual cycle.  Every day smoker   Past Medical History  Diagnosis Date  . RHINITIS, ALLERGIC 06/28/2006  . TOBACCO USER 05/03/2009  . MENSTRUATION, PAINFUL 06/28/2006  . 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 CERVICAL DYSPLASIA 06/28/2006  . ALLERGIC CONJUNCTIVITIS 06/28/2006   No Known Allergies  Review of Systems Per HPI    Objective:   Physical Exam BP 120/77 mmHg  Pulse 93  Temp(Src) 98 F (36.7 C) (Oral)  Ht 5' 4.5" (1.638 m)  Wt 128 lb (58.06 kg)  BMI 21.64 kg/m2  LMP 04/08/2014 Gen: Pleasant Caucasian female, no acute distress, nontoxic in appearance, well-developed, well-nourished. HEENT: AT. [place]. Bilateral eyes without injections or icterus. MMM. Abd: Soft. Thin. NTND. BS present. No Masses palpated.  MSK: No CVA tenderness bilaterally.    Assessment & Plan:

## 2014-04-09 ENCOUNTER — Ambulatory Visit: Payer: Managed Care, Other (non HMO) | Admitting: Family Medicine

## 2014-04-10 ENCOUNTER — Telehealth: Payer: Self-pay | Admitting: Family Medicine

## 2014-04-10 LAB — URINE CULTURE
COLONY COUNT: NO GROWTH
Organism ID, Bacteria: NO GROWTH

## 2014-04-10 NOTE — Telephone Encounter (Signed)
Please call Melissa Shelton, her Urine culture did not grow anything. Her urine did show some small calcium oxalate crystals. Her discomfort could have been coming from those small crystals. She can discontinue the antibiotic, drink plenty of water. If she has a small stone, or continued discomfort she can take aleve and if continues or she gets a fever she should come in to be seen. Thanks.

## 2014-10-20 ENCOUNTER — Ambulatory Visit: Payer: Managed Care, Other (non HMO) | Admitting: Family Medicine

## 2015-03-09 ENCOUNTER — Encounter: Payer: Self-pay | Admitting: Family Medicine

## 2015-03-09 ENCOUNTER — Ambulatory Visit (INDEPENDENT_AMBULATORY_CARE_PROVIDER_SITE_OTHER): Payer: Managed Care, Other (non HMO) | Admitting: Family Medicine

## 2015-03-09 VITALS — BP 106/78 | HR 66 | Temp 98.1°F | Ht 65.5 in | Wt 122.2 lb

## 2015-03-09 DIAGNOSIS — R300 Dysuria: Secondary | ICD-10-CM | POA: Diagnosis not present

## 2015-03-09 DIAGNOSIS — R399 Unspecified symptoms and signs involving the genitourinary system: Secondary | ICD-10-CM | POA: Diagnosis not present

## 2015-03-09 LAB — POCT UA - MICROSCOPIC ONLY

## 2015-03-09 LAB — POCT URINALYSIS DIPSTICK
Bilirubin, UA: NEGATIVE
Blood, UA: NEGATIVE
Glucose, UA: NEGATIVE
KETONES UA: NEGATIVE
NITRITE UA: NEGATIVE
PH UA: 5.5
PROTEIN UA: NEGATIVE
Spec Grav, UA: >=1.03
Urobilinogen, UA: 0.2

## 2015-03-09 MED ORDER — CEPHALEXIN 500 MG PO CAPS: 500.0000 mg | ORAL_CAPSULE | Freq: Two times a day (BID) | ORAL | Status: DC

## 2015-03-09 NOTE — Progress Notes (Signed)
Patient ID: Melissa Shelton, female   DOB: Aug 30, 1985, 29 y.o.   MRN: [id number]   Subjective: CC:  Dysuria HPI: This history was provided by the patient.  Patient's PMH significant for chronic venous insufficiency, GERD, allergic rhinitis, vaginosis and psoriasis.  Patient is a 29 y.o. female presenting to same day clinic appointment today for dysuria x 3 days.  Patient also complains of lower abdominal pain that is worse just after voiding.  Additionally, patient complains of urinary frequency, urgency and a sensation of bladder pressure after voiding.  Patient denies N/V/D, fever, flank pain and vaginal discharge.  Patient denies history of STI exposure, but states she had vaginosis once in past and was treated with antibiotics.  Patient states she has had at least one UTI in past and was treated with antibiotics.    Concerns today include:  1. Dysuria  Social History Reviewed: Current every day smoker. FamHx and MedHx updated.  Please see EMR.  ROS: Per HPI  Objective: Office vital signs reviewed. BP 106/78 mmHg  Pulse 66  Temp(Src) 98.1 F (36.7 C) (Oral)  Ht 5' 5.5" (1.664 m)  Wt 122 lb 3 oz (55.424 kg)  BMI 20.02 kg/m2  LMP 02/17/2015  Physical Examination:  General: Pleasant, cooperative white female who appears stated age.  Awake, alert, well-nourished, NAD Cardio: RRR, S1S2 heard, no murmurs appreciated, no edema; +2 radial pulses bilaterally Pulm: CTAB, no wheezes, rhonchi or rales GI: soft, NT/ND,+BS x4, no hepatomegaly, no splenomegaly GU: Denies CVA tenderness to palpation, bladder not palpable  Assessment/ Plan: 29 y.o. female who presents with complaints of dysuria and other urinary symptoms consistent with UTI.  Patient has had at least one UTI in past year.  Patient is not febrile and denies N/V/D, so doubt pyelo.  Patient denies vaginal discharge or previous STI diagnosis, so doubt PID.    UA done in clinic today indicates trace leukocytes.  The patient's story  is compelling for UTI.  That, combined with presence of leukocytes in UA warrants starting antibiotic treatment.  We will also culture the urine.  If it is negative for bacterial growth, we will contact the patient and advise her to stop the antibiotics.  This plan was discussed with the patient and she is in agreement.    1. UTI symptoms - POCT urinalysis dipstick - POCT UA - Microscopic Only - Urine culture  2. Dysuria - cephALEXin (KEFLEX) 500 MG capsule; Take 1 capsule (500 mg total) by mouth 2 (two) times daily.  Dispense: 14 capsule; Refill: 0  Follow-up Advised patient to call clinic or go to Urgent Care if she worsens by developing fever, N/V or flank pain within next week or so.      Sherron Ales, NP [facility]  Resident Attestation I have seen and examined the patient. I have read and edited the above note to reflect my findings. I have formulated the assessment and plan and discussed with Ms. Owens Shark.  Tawanna Sat, MD 03/10/2015, 11:39 PM PGY-3, [place]

## 2015-03-09 NOTE — Patient Instructions (Signed)
Keflex 500 mg twice a day for 7 days.

## 2015-03-11 LAB — URINE CULTURE
COLONY COUNT: NO GROWTH
Organism ID, Bacteria: NO GROWTH

## 2015-03-12 ENCOUNTER — Telehealth: Payer: Self-pay | Admitting: Family Medicine

## 2015-03-12 NOTE — Telephone Encounter (Signed)
Attempted to call patient regarding negative urine culture results to inform her she could stop taking the keflex that was prescribed. Will try calling again later.

## 2015-07-08 ENCOUNTER — Ambulatory Visit (INDEPENDENT_AMBULATORY_CARE_PROVIDER_SITE_OTHER): Payer: Managed Care, Other (non HMO) | Admitting: Family Medicine

## 2015-07-08 ENCOUNTER — Encounter: Payer: Self-pay | Admitting: Family Medicine

## 2015-07-08 VITALS — BP 105/56 | HR 67 | Temp 98.3°F | Wt 127.5 lb

## 2015-07-08 DIAGNOSIS — R69 Illness, unspecified: Secondary | ICD-10-CM

## 2015-07-08 DIAGNOSIS — J111 Influenza due to unidentified influenza virus with other respiratory manifestations: Secondary | ICD-10-CM | POA: Diagnosis not present

## 2015-07-08 MED ORDER — HYDROCODONE-HOMATROPINE 5-1.5 MG/5ML PO SYRP: 5.0000 mL | ORAL_SOLUTION | Freq: Three times a day (TID) | ORAL | Status: DC | PRN

## 2015-07-08 NOTE — Patient Instructions (Signed)

## 2015-07-11 NOTE — Progress Notes (Signed)
   Subjective:   Melissa Shelton is a 30 y.o. female with a history of reflux here for cough, fever  URI  Has been sick for 4 days. Nasal discharge: yes Medications tried: motrin Sick contacts: multiple, boyfriend had flu  Symptoms Fever: yes, subjective with alternating sweats and chills Headache or face pain: yes Tooth pain: no Sneezing: no Scratchy throat: no Allergies: yes Muscle aches: yes Severe fatigue: yes Stiff neck: no Shortness of breath: no Rash: no Sore throat or swollen glands: yes  Review of Systems:  Per HPI. All other systems reviewed and are negative.   PMH, PSH, Medications, Allergies, and FmHx reviewed and updated in EMR.  Social History: current smoker  Objective:  BP 105/56 mmHg  Pulse 67  Temp(Src) 98.3 F (36.8 C) (Oral)  Wt 127 lb 8 oz (57.834 kg)  SpO2 100%  LMP 06/12/2015  Gen:  30 y.o. female in NAD but appears quite uncomfortable HEENT: NCAT, MMM, EOMI, PERRL, anicteric sclerae, tonsillar edema without exudate, turbinate edema, rhinorrhea CV: RRR, no MRG, no JVD Resp: Non-labored, CTAB, no wheezes noted Abd: Soft, NTND, BS present, no guarding or organomegaly Ext: WWP, no edema MSK: Full ROM, strength intact Neuro: Alert and oriented, speech normal      Chemistry      Component Value Date/Time   NA 140 08/28/2013 1613   K 3.8 08/28/2013 1613   CL 104 08/28/2013 1613   CO2 29 08/28/2013 1613   BUN 9 08/28/2013 1613   CREATININE 0.56 08/28/2013 1613   CREATININE 0.6 06/16/2010 2302      Component Value Date/Time   CALCIUM 9.6 08/28/2013 1613      Lab Results  Component Value Date   WBC 7.5 08/28/2013   HGB 12.2 08/28/2013   HCT 36.5 08/28/2013   MCV 81.5 08/28/2013   PLT 212 08/28/2013   No results found for: TSH No results found for: HGBA1C Assessment & Plan:     Melissa Shelton is a 30 y.o. female here for flu  Influenza-like illness Fever, cough, headache, muscle aches, sore throat x4 days. Likely flu - too  late for tamiflu, treat symptomatically - out of work until Saturday - hycodan for cough, rec motrin for fevers and aches - f/u if fever beyond 1 week or resickening, cautioned about complications and control of spread      Beverlyn Roux, MD, MPH [place] PGY-3 07/11/2015 2:12 PM

## 2015-07-11 NOTE — Assessment & Plan Note (Signed)
Fever, cough, headache, muscle aches, sore throat x4 days. Likely flu - too late for tamiflu, treat symptomatically - out of work until Saturday - hycodan for cough, rec motrin for fevers and aches - f/u if fever beyond 1 week or resickening, cautioned about complications and control of spread

## 2016-11-29 ENCOUNTER — Encounter: Payer: Self-pay | Admitting: Family Medicine

## 2016-11-29 ENCOUNTER — Ambulatory Visit (INDEPENDENT_AMBULATORY_CARE_PROVIDER_SITE_OTHER): Payer: Managed Care, Other (non HMO) | Admitting: Family Medicine

## 2016-11-29 VITALS — BP 100/64 | HR 87 | Temp 98.2°F | Wt 142.0 lb

## 2016-11-29 DIAGNOSIS — R399 Unspecified symptoms and signs involving the genitourinary system: Secondary | ICD-10-CM | POA: Diagnosis not present

## 2016-11-29 LAB — POCT URINALYSIS DIP (MANUAL ENTRY)
BILIRUBIN UA: NEGATIVE
GLUCOSE UA: NEGATIVE mg/dL
Ketones, POC UA: NEGATIVE mg/dL
Leukocytes, UA: NEGATIVE
NITRITE UA: NEGATIVE
Protein Ur, POC: NEGATIVE mg/dL
RBC UA: NEGATIVE
Spec Grav, UA: >=1.03 — AB (ref 1.010–1.025)
Urobilinogen, UA: 0.2 E.U./dL
pH, UA: 5.5 (ref 5.0–8.0)

## 2016-11-29 MED ORDER — CEPHALEXIN 500 MG PO CAPS: 500.0000 mg | ORAL_CAPSULE | Freq: Two times a day (BID) | ORAL | 0 refills | Status: AC

## 2016-11-29 NOTE — Patient Instructions (Signed)
Acute Urinary Retention, Female  Acute urinary retention is the temporary inability to urinate. This is an uncommon problem in women. It can be caused by:   Infection.   A side effect of a medicine.   A problem in a nearby organ that presses or squeezes on the bladder or the urethra (the tube that drains the bladder).   Psychological problems.   Surgery on your bladder, urethra, or pelvic organs that causes obstruction to the outflow of urine from your bladder.    Follow these instructions at home:  If you are sent home with a Foley catheter and a drainage system, you will need to discuss the best course of action with your health care provider. While the catheter is in, maintain a good intake of fluids. Keep the drainage bag emptied and lower than your catheter. This is so that contaminated urine will not flow back into your bladder, which could lead to a urinary tract infection.  There are two main types of drainage bags. One is a large bag that usually is used at night. It has a good capacity that will allow you to sleep through the night without having to empty it. The second type is called a leg bag. It has a smaller capacity so it needs to be emptied more frequently. However, the main advantage is that it can be attached by a leg strap and goes underneath your clothing, allowing you the freedom to move about or leave your home.  Only take over-the-counter or prescription medicines for pain, discomfort, or fever as directed by your health care provider.  Contact a health care provider if:   You develop a low-grade fever.   You experience spasms or leakage of urine with the spasms.  Get help right away if:   You develop chills or fever.   Your catheter stops draining urine.   Your catheter falls out.   You start to develop increased bleeding that does not respond to rest and increased fluid intake.  This information is not intended to replace advice given to you by your health care provider. Make sure  you discuss any questions you have with your health care provider.  Document Released: 04/16/2006 Document Revised: 09/29/2015 Document Reviewed: 09/26/2012  Elsevier Interactive Patient Education  2017 [employer]

## 2016-11-30 NOTE — Progress Notes (Signed)
   Subjective:    Patient ID: Melissa Shelton, female    DOB: Mar 20, 1986, 30 y.o.   MRN: [id number]   CC: Dysuria  HPI: Patient is 31 yo female who presents today complaining of dysuria, increase frequency and hesitancy since last Thursday ( 7/26).  Patient reports some burning with urination, increase frequency and feeling like she need to use to restroom but having very little out put. Patient reports that symptoms are reminiscent of UTI she had two years ago. Patient endorses take three pills of ciprofloxacin that were given to her by her friend over the weekend. She also report taking some Azo. Symptoms continue to be bothersome which lead her to today's visit. Patient denies any fever, chills, abdominal or flank pain, hematuria or discharge.  Smoking status reviewed   ROS: all other systems were reviewed and are negative other than in the HPI   Past Medical History:  Diagnosis Date  . ALLERGIC CONJUNCTIVITIS 06/28/2006  . CERVICAL DYSPLASIA 06/28/2006  . 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 MENSTRUATION, PAINFUL 06/28/2006  . RHINITIS, ALLERGIC 06/28/2006  . TOBACCO USER 05/03/2009    Past Surgical History:  Procedure Laterality Date  . APPENDECTOMY    . COLPOSCOPY     X2, biopsy only, no ablation  . TUBAL LIGATION Bilateral 02/21/2013   Procedure: POST PARTUM TUBAL LIGATION;  Surgeon: Woodroe Mode, MD;  Location: [facility];  Service: Gynecology;  Laterality: Bilateral;    Past medical history, surgical, family, and social history reviewed and updated in the EMR as appropriate.  Objective:  BP 100/64   Pulse 87   Temp 98.2 F (36.8 C) (Oral)   Wt 142 lb (64.4 kg)   LMP 11/19/2016   SpO2 97%   BMI 23.27 kg/m   Vitals and nursing note reviewed  General: NAD, pleasant, able to participate in exam Cardiac: RRR, normal heart sounds, no murmurs. 2+ radial and PT pulses bilaterally Respiratory: CTAB, normal effort, No wheezes, rales or rhonchi Abdomen: soft, nontender,  nondistended, no hepatic or splenomegaly, +BS Extremities: no edema or cyanosis. WWP. Skin: warm and dry, no rashes noted Neuro: alert and oriented x4, no focal deficits Psych: Normal affect and mood   Assessment & Plan:   #Dysuria, increase frequency and hesitancy Symptoms described by patient are consistent with urinary tract infection. UA did not show any leukocytes or nitrite. Patient with limited cipro treatment. Given continued symptoms will treat as UTI and follow up with culture. --Order Keflex 500 mg bid for 7 days --Follow up on urine culture --Return if symptoms do not resolve after treatment    Marjie Skiff, MD [place] PGY-2

## 2017-01-31 ENCOUNTER — Ambulatory Visit (INDEPENDENT_AMBULATORY_CARE_PROVIDER_SITE_OTHER): Payer: Managed Care, Other (non HMO) | Admitting: Internal Medicine

## 2017-01-31 ENCOUNTER — Encounter: Payer: Self-pay | Admitting: Internal Medicine

## 2017-01-31 VITALS — BP 128/86 | HR 84 | Temp 98.2°F | Wt 141.0 lb

## 2017-01-31 DIAGNOSIS — K0889 Other specified disorders of teeth and supporting structures: Secondary | ICD-10-CM | POA: Diagnosis not present

## 2017-01-31 MED ORDER — OXYCODONE-ACETAMINOPHEN 5-325 MG PO TABS: 1.0000 | ORAL_TABLET | Freq: Three times a day (TID) | ORAL | 0 refills | Status: DC | PRN

## 2017-01-31 MED ORDER — AMOXICILLIN-POT CLAVULANATE 875-125 MG PO TABS: 1.0000 | ORAL_TABLET | Freq: Two times a day (BID) | ORAL | 0 refills | Status: DC

## 2017-01-31 NOTE — Patient Instructions (Signed)
Ms. Shoe,  I recommend taking the antibiotic augmentin twice daily to help with inflammation of your gums. Please make an appointment as soon as possible with a dentist for full evaluation.  Continue ibuprofen. Take with food if able. Take oxycodone for severe pain.  If you would like help discussing strategies for smoking cessation, I recommend making an appointment with our pharmacist Dr. Valentina Lucks.  Signs that your infection is getting worse and requires emergency care include trouble swallowing and swelling of your tongue.  Best, Dr. Ola Spurr

## 2017-02-01 DIAGNOSIS — K0889 Other specified disorders of teeth and supporting structures: Secondary | ICD-10-CM | POA: Insufficient documentation

## 2017-02-01 NOTE — Progress Notes (Signed)
[facility] Progress Note  Subjective:  Melissa Shelton is a 31 y.o. female with history of allergic rhinitis and tobacco abuse who presents for dental pain. Patient says she has been having gum pain near a broken tooth and lower jaw pain since the end of last week. Feels a bulge near the tooth. Has been taking about 10-12 ibuprofen (200 mg) daily with some relief. Able to eat okay by chewing on left side of mouth. Reports she has a history of cavities that she attributes to low calcium as a child. Denies drug use. Does not regularly follow with a dentist; just got dental insurance through her job. ROS: No drooling or difficulty swallowing, denies fevers  Social: Current smoker; 1/2 PPD, working to cut back   No Known Allergies  Objective: Blood pressure 128/86, pulse 84, temperature 98.2 F (36.8 C), temperature source Oral, weight 141 lb (64 kg), last menstrual period 01/13/2017, SpO2 92 %. Body mass index is 23.11 kg/m. Constitutional: Well appearing female in NAD HENT: Poor dentition. Multiple caries and broken teeth. Broken molar of right lower jaw with increased prominence at lingual side, though no fluctuance. Gums do not appear grossly inflamed but has increased TTP over lower gumline, especially on left, with recession in some areas. No lymphadenopathy but slight TTP over upper cervical chain on left.  Cardiovascular: RRR, S1, S2, no m/r/g.  Pulmonary/Chest: No respiratory distress.  Neurological: AOx3, no focal deficits. Psychiatric: Normal mood and affect.  Vitals reviewed  Assessment/Plan: Pain, dental - Poor dentition and gingivitis. Prescribed augmentin x 10 days and encouraged patient to get an appointment with a dentist ASAP for likely extraction. No evidence of abscess and patient afebrile.  - Recommended continuing ibuprofen for pain and provided short course of oxycodone for severe discomfort.  - Counseled on increased risk of oral cancers with smoking and  need for regular dental check-ups.  - Provided list of dental providers that take self-pay if patient cannot find a provider through her insurance  Meds ordered this encounter  Medications  . amoxicillin-clavulanate (AUGMENTIN) 875-125 MG tablet    Sig: Take 1 tablet by mouth 2 (two) times daily.    Dispense:  20 tablet    Refill:  0  . oxyCODONE-acetaminophen (ROXICET) 5-325 MG tablet    Sig: Take 1 tablet by mouth every 8 (eight) hours as needed for severe pain.    Dispense:  10 tablet    Refill:  0   Follow-up prn.  Olene Floss, MD [place], PGY-3

## 2017-02-01 NOTE — Assessment & Plan Note (Addendum)
-   Poor dentition and gingivitis. Prescribed augmentin x 10 days and encouraged patient to get an appointment with a dentist ASAP for likely extraction. No evidence of abscess and patient afebrile.  - Recommended continuing ibuprofen for pain and provided short course of oxycodone for severe discomfort.  - Counseled on increased risk of oral cancers with smoking and need for regular dental check-ups.  - Provided list of dental providers that take self-pay if patient cannot find a provider through her insurance

## 2017-04-11 ENCOUNTER — Other Ambulatory Visit: Payer: Self-pay

## 2017-04-11 ENCOUNTER — Ambulatory Visit (INDEPENDENT_AMBULATORY_CARE_PROVIDER_SITE_OTHER): Payer: Managed Care, Other (non HMO) | Admitting: Internal Medicine

## 2017-04-11 ENCOUNTER — Other Ambulatory Visit: Payer: Self-pay | Admitting: Internal Medicine

## 2017-04-11 ENCOUNTER — Encounter: Payer: Self-pay | Admitting: Internal Medicine

## 2017-04-11 DIAGNOSIS — Z23 Encounter for immunization: Secondary | ICD-10-CM

## 2017-04-11 DIAGNOSIS — F17200 Nicotine dependence, unspecified, uncomplicated: Secondary | ICD-10-CM

## 2017-04-11 DIAGNOSIS — L400 Psoriasis vulgaris: Secondary | ICD-10-CM

## 2017-04-11 DIAGNOSIS — Z0000 Encounter for general adult medical examination without abnormal findings: Secondary | ICD-10-CM

## 2017-04-11 MED ORDER — CLOBETASOL PROPIONATE 0.05 % EX CREA: 1.0000 "application " | TOPICAL_CREAM | Freq: Two times a day (BID) | CUTANEOUS | 0 refills | Status: AC

## 2017-04-11 NOTE — Patient Instructions (Addendum)
Please return for your PAP as soon as possible as it is about one year past due.

## 2017-04-11 NOTE — Assessment & Plan Note (Signed)
Currently not interested in quitting. Offered support in the future if needed and discussed benefit of pharmacy clinic for additional option of support.

## 2017-04-11 NOTE — Assessment & Plan Note (Signed)
Evidence of psoriasis present on skin. Patient reports it has been much more significant in the past. Refill on Clobetasol given today as skin manifestations tend to worsen in winter. Reviewed dry skin management.

## 2017-04-11 NOTE — Progress Notes (Signed)
   Subjective:    Melissa Shelton - 31 y.o. female MRN [id number]  Date of birth: [birth year]  HPI  Melissa Shelton is here for physical exam. No concerns today.   Psoriasis: Reports that she has long standing history of psoriasis. Currently only using lotions. Has used Clobetasol in the past with good skin relief. Symptoms tend to worsen in the winter.   Tobacco Use: Currently smoking 1/2 ppd. Uses electronic cigarette that helps to lessen the amount of regular cigarettes she smokes per day. When asked if she is interested in quitting she responds "yes and no". States she is not yet ready to make a big change in her smoking habits.   ROS:  Patient reports no  vision/ hearing changes,anorexia, weight change, fever ,adenopathy, persistant / recurrent hoarseness, swallowing issues, chest pain, edema,persistant / recurrent cough, hemoptysis, dyspnea(rest, exertional, paroxysmal nocturnal), gastrointestinal  bleeding (melena, rectal bleeding), abdominal pain, excessive heart burn, GU symptoms(dysuria, hematuria, pyuria, voiding/incontinence  Issues) syncope, focal weakness, severe memory loss, concerning skin lesions, depression, anxiety, abnormal bruising/bleeding, major joint swelling, breast masses or abnormal vaginal bleeding.    -  reports that she has been smoking cigarettes.  She has a 4.00 pack-year smoking history. she has never used smokeless tobacco. - Review of Systems: Per HPI. - Past Medical History: Patient Active Problem List   Diagnosis Date Noted  . Pain, dental 02/01/2017  . Influenza-like illness 07/08/2015  . Rash and nonspecific skin eruption 01/15/2014  . Vaginal discharge 12/04/2013  . Exposure to sexually transmitted disease (STD) 12/04/2013  . Dysuria 12/04/2013  . Injury, crush, foot 11/17/2013  . GERD (gastroesophageal reflux disease) 04/04/2013  . Poor dentition 10/23/2012  . Chronic venous insufficiency 09/25/2012  . 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 Yeast  vaginitis 08/02/2012  . 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 TOBACCO USER 05/03/2009  . ALLERGIC CONJUNCTIVITIS 06/28/2006  . RHINITIS, ALLERGIC 06/28/2006  . Dysplasia of cervix (uteri) 06/28/2006   - Medications: reviewed and updated   Objective:   Physical Exam BP 104/68   Pulse 82   Temp 98.3 F (36.8 C) (Oral)   Ht 5\' 6"  (1.676 m)   Wt 140 lb 12.8 oz (63.9 kg)   LMP 04/08/2017   SpO2 95%   BMI 22.73 kg/m  Gen: NAD, alert, cooperative with exam, well-appearing HEENT: NCAT, PERRL, clear conjunctiva, oropharynx clear, supple neck, poor dentition  CV: RRR, good S1/S2, no murmur, no edema, capillary refill brisk  Resp: CTABL, no wheezes, non-labored Abd: SNTND, BS present, no guarding or organomegaly Skin: several areas of scaling small patches diffusely spread on extensor surfaces of legs and arms  Neuro: no gross deficits.  Psych: good insight, alert and oriented     Assessment & Plan:   Psoriasis vulgaris Evidence of psoriasis present on skin. Patient reports it has been much more significant in the past. Refill on Clobetasol given today as skin manifestations tend to worsen in winter. Reviewed dry skin management.   TOBACCO USER Currently not interested in quitting. Offered support in the future if needed and discussed benefit of pharmacy clinic for additional option of support.   Noted that patient is due for PAP as last screening in 2014. Pap in 2014 negative but did not include HPV testing so she is due for PAP with co-testing given age >30. Patient currently menstruating and wishes to return.   Phill Myron, D.O. 04/11/2017, 10:19 AM PGY-3, [place]

## 2017-05-22 ENCOUNTER — Encounter (HOSPITAL_COMMUNITY): Payer: Self-pay | Admitting: Emergency Medicine

## 2017-05-22 ENCOUNTER — Other Ambulatory Visit: Payer: Self-pay

## 2017-05-22 ENCOUNTER — Emergency Department (HOSPITAL_COMMUNITY)
Admission: EM | Admit: 2017-05-22 | Discharge: 2017-05-22 | Disposition: A | Discharge status: Home / Self Care | Payer: Managed Care, Other (non HMO) | Attending: Emergency Medicine | Admitting: Emergency Medicine

## 2017-05-22 DIAGNOSIS — K029 Dental caries, unspecified: Secondary | ICD-10-CM | POA: Diagnosis not present

## 2017-05-22 DIAGNOSIS — K0889 Other specified disorders of teeth and supporting structures: Secondary | ICD-10-CM

## 2017-05-22 DIAGNOSIS — F17210 Nicotine dependence, cigarettes, uncomplicated: Secondary | ICD-10-CM | POA: Insufficient documentation

## 2017-05-22 DIAGNOSIS — Z79899 Other long term (current) drug therapy: Secondary | ICD-10-CM | POA: Diagnosis not present

## 2017-05-22 MED ORDER — NAPROXEN 375 MG PO TABS: 375.0000 mg | ORAL_TABLET | Freq: Two times a day (BID) | ORAL | 0 refills | Status: AC

## 2017-05-22 MED ORDER — ACETAMINOPHEN 325 MG PO TABS
650.0000 mg | ORAL_TABLET | Freq: Once | ORAL | Status: AC
  Administered 2017-05-22: 650 mg via ORAL
  Filled 2017-05-22: qty 2

## 2017-05-22 MED ORDER — PENICILLIN V POTASSIUM 500 MG PO TABS: 500.0000 mg | ORAL_TABLET | Freq: Four times a day (QID) | ORAL | 0 refills | Status: AC

## 2017-05-22 NOTE — ED Provider Notes (Signed)
[facility] EMERGENCY DEPARTMENT Provider Note   CSN: [id number] Arrival date & time: 05/22/17  2007     History   Chief Complaint Chief Complaint  Patient presents with  . Dental Pain    HPI Melissa Shelton is a 32 y.o. female.  Melissa Shelton is a 32 y.o. Female who presents to the emergency department complaining of right lower dental pain ongoing since yesterday.  She reports her pain is gradually been worsening.  She has taken ibuprofen and Orajel without relief.  She does not have a dental provider.  She denies fevers, discharge from the mouth, sore throat, trouble swallowing, or neck pain.    The history is provided by the patient, medical records and the spouse. No language interpreter was used.  Dental Pain      Past Medical History:  Diagnosis Date  . ALLERGIC CONJUNCTIVITIS 06/28/2006  . CERVICAL DYSPLASIA 06/28/2006  . 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 MENSTRUATION, PAINFUL 06/28/2006  . RHINITIS, ALLERGIC 06/28/2006  . TOBACCO USER 05/03/2009    Patient Active Problem List   Diagnosis Date Noted  . Pain, dental 02/01/2017  . Influenza-like illness 07/08/2015  . Rash and nonspecific skin eruption 01/15/2014  . Vaginal discharge 12/04/2013  . Exposure to sexually transmitted disease (STD) 12/04/2013  . Dysuria 12/04/2013  . Injury, crush, foot 11/17/2013  . GERD (gastroesophageal reflux disease) 04/04/2013  . Poor dentition 10/23/2012  . Chronic venous insufficiency 09/25/2012  . 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 Yeast vaginitis 08/02/2012  . 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 TOBACCO USER 05/03/2009  . ALLERGIC CONJUNCTIVITIS 06/28/2006  . RHINITIS, ALLERGIC 06/28/2006  . Dysplasia of cervix (uteri) 06/28/2006    Past Surgical History:  Procedure Laterality Date  . APPENDECTOMY    . COLPOSCOPY     X2, biopsy only, no ablation  . TUBAL LIGATION Bilateral 02/21/2013   Procedure: POST  PARTUM TUBAL LIGATION;  Surgeon: Woodroe Mode, MD;  Location: [facility];  Service: Gynecology;  Laterality: Bilateral;    OB History    Gravida Para Term Preterm AB Living   4 3 3   1 3    SAB TAB Ectopic Multiple Live Births     1     3       Home Medications    Prior to Admission medications   Medication Sig Start Date End Date Taking? Authorizing Provider  clobetasol cream (TEMOVATE) 2.42 % Apply 1 application topically 2 (two) times daily. 04/11/17   Nicolette Bang, DO  naproxen (NAPROSYN) 375 MG tablet Take 1 tablet (375 mg total) by mouth 2 (two) times daily with a meal. 05/22/17   Waynetta Pean, PA-C  penicillin v potassium (VEETID) 500 MG tablet Take 1 tablet (500 mg total) by mouth 4 (four) times daily. 05/22/17   Waynetta Pean, PA-C  polyethylene glycol powder (GLYCOLAX/MIRALAX) powder Take 17 g by mouth daily. 05/15/13   Gerda Diss, DO  Prenatal Vit-Fe Fumarate-FA (PRENATAL MULTIVITAMIN) TABS Take 1 tablet by mouth daily at 12 noon.    [provider]    Family History Family History  Problem Relation Age of Onset  . Cervical cancer Unknown        Prevelant accross maternal side of family  . High blood pressure Unknown        on maternal side  . Diabetes Unknown   . Autoimmune disease Unknown     Social History Social History  Tobacco Use  . Smoking status: Current Every Day Smoker    Packs/day: 0.50    Years: 8.00    Pack years: 4.00    Types: Cigarettes    Last attempt to quit: 07/10/2012    Years since quitting: 4.8  . Smokeless tobacco: Never Used  . Tobacco comment: Quit cold [place] when found out she was pregnant  Substance Use Topics  . Alcohol use: No  . Drug use: No     Allergies   Patient has no known allergies.   Review of Systems Review of Systems  Constitutional: Negative for chills and fever.  HENT: Positive for dental problem. Negative for drooling, ear pain, facial swelling, sore throat and trouble  swallowing.   Eyes: Negative for pain and visual disturbance.  Musculoskeletal: Negative for neck pain and neck stiffness.  Skin: Negative for rash.  Neurological: Negative for headaches.     Physical Exam Updated Vital Signs BP 136/65 (BP Location: Right Arm)   Pulse 80   Temp 98.3 F (36.8 C) (Oral)   Resp 18   Ht 5\' 5"  (1.651 m)   Wt 63.5 kg (140 lb)   LMP 05/05/2017   SpO2 100%   BMI 23.30 kg/m   Physical Exam  Constitutional: She is oriented to person, place, and time. She appears well-developed and well-nourished. No distress.  Non-toxic appearing.   HENT:  Head: Normocephalic and atraumatic.  Right Ear: External ear normal.  Left Ear: External ear normal.  Mouth/Throat: Oropharynx is clear and moist. No oropharyngeal exudate.  Tenderness to right lower tooth. Generally poor dentition. Small area of induration noted around tooth, without fluctuance or gross abscess.  No discharge from the mouth. No facial swelling.  Uvula is midline without edema. Soft palate rises symmetrically. No tonsillar hypertrophy or exudates. Tongue protrusion is normal. No trismus. No PTA.   Eyes: Conjunctivae are normal. Pupils are equal, round, and reactive to light. Right eye exhibits no discharge. Left eye exhibits no discharge.  Neck: Normal range of motion. Neck supple. No JVD present. No tracheal deviation present.  Cardiovascular: Normal rate and intact distal pulses.  Pulmonary/Chest: Effort normal. No respiratory distress.  Lymphadenopathy:    She has no cervical adenopathy.  Neurological: She is alert and oriented to person, place, and time. No cranial nerve deficit. Coordination normal.  Skin: No rash noted. She is not diaphoretic.  Psychiatric: She has a normal mood and affect. Her behavior is normal.  Nursing note and vitals reviewed.    ED Treatments / Results  Labs (all labs ordered are listed, but only abnormal results are displayed) Labs Reviewed - No data to  display  EKG  EKG Interpretation None       Radiology No results found.  Procedures Procedures (including critical care time)  Medications Ordered in ED Medications - No data to display   Initial Impression / Assessment and Plan / ED Course  I have reviewed the triage vital signs and the nursing notes.  Pertinent labs & imaging results that were available during my care of the patient were reviewed by me and considered in my medical decision making (see chart for details).    This is a 32 y.o. Female who presents to the emergency department complaining of right lower dental pain ongoing since yesterday.  She reports her pain is gradually been worsening.  She has taken ibuprofen and Orajel without relief. Patient with toothache.  No gross abscess.  Exam unconcerning for Ludwig's angina or spread  of infection.  Will treat with penicillin and pain medicine.  Urged patient to follow-up with dentist Dr. Maurilio Lovely. I advised the patient to follow-up with their primary care provider this week. I advised the patient to return to the emergency department with new or worsening symptoms or new concerns. The patient verbalized understanding and agreement with plan.      Final Clinical Impressions(s) / ED Diagnoses   Final diagnoses:  Pain, dental  Dental caries    ED Discharge Orders        Ordered    penicillin v potassium (VEETID) 500 MG tablet  4 times daily     05/22/17 2040    naproxen (NAPROSYN) 375 MG tablet  2 times daily with meals     05/22/17 2040       Waynetta Pean, PA-C 05/22/17 2045    Milton Ferguson, MD 05/22/17 2334

## 2017-05-22 NOTE — ED Triage Notes (Signed)
Pt c/o 9/10 right lower dental pain since last night not getting better with OTC medication.

## 2017-10-14 ENCOUNTER — Emergency Department (HOSPITAL_COMMUNITY): Payer: Managed Care, Other (non HMO)

## 2017-10-14 ENCOUNTER — Encounter (HOSPITAL_COMMUNITY): Payer: Self-pay | Admitting: Emergency Medicine

## 2017-10-14 ENCOUNTER — Other Ambulatory Visit: Payer: Self-pay

## 2017-10-14 ENCOUNTER — Emergency Department (HOSPITAL_COMMUNITY)
Admission: EM | Admit: 2017-10-14 | Discharge: 2017-10-14 | Disposition: A | Discharge status: Home / Self Care | Payer: Managed Care, Other (non HMO) | Attending: Emergency Medicine | Admitting: Emergency Medicine

## 2017-10-14 DIAGNOSIS — R102 Pelvic and perineal pain: Secondary | ICD-10-CM | POA: Diagnosis present

## 2017-10-14 DIAGNOSIS — Z79899 Other long term (current) drug therapy: Secondary | ICD-10-CM | POA: Insufficient documentation

## 2017-10-14 LAB — COMPREHENSIVE METABOLIC PANEL
ALK PHOS: 54 U/L (ref 38–126)
ALT: 13 U/L — AB (ref 14–54)
AST: 15 U/L (ref 15–41)
Albumin: 4.2 g/dL (ref 3.5–5.0)
Anion gap: 7 (ref 5–15)
BUN: 6 mg/dL (ref 6–20)
CALCIUM: 9.3 mg/dL (ref 8.9–10.3)
CHLORIDE: 106 mmol/L (ref 101–111)
CO2: 24 mmol/L (ref 22–32)
CREATININE: 0.66 mg/dL (ref 0.44–1.00)
GFR calc non Af Amer: >60 mL/min (ref >60)
GLUCOSE: 101 mg/dL — AB (ref 65–99)
Potassium: 4.5 mmol/L (ref 3.5–5.1)
SODIUM: 137 mmol/L (ref 135–145)
Total Bilirubin: 0.4 mg/dL (ref 0.3–1.2)
Total Protein: 7 g/dL (ref 6.5–8.1)

## 2017-10-14 LAB — I-STAT BETA HCG BLOOD, ED (MC, WL, AP ONLY)

## 2017-10-14 LAB — CBC
HCT: 45.2 % (ref 36.0–46.0)
HEMOGLOBIN: 14.3 g/dL (ref 12.0–15.0)
MCH: 28.1 pg (ref 26.0–34.0)
MCHC: 31.6 g/dL (ref 30.0–36.0)
MCV: 88.8 fL (ref 78.0–100.0)
PLATELETS: 202 10*3/uL (ref 150–400)
RBC: 5.09 MIL/uL (ref 3.87–5.11)
RDW: 14.2 % (ref 11.5–15.5)
WBC: 10 10*3/uL (ref 4.0–10.5)

## 2017-10-14 LAB — URINALYSIS, ROUTINE W REFLEX MICROSCOPIC
Bilirubin Urine: NEGATIVE
Glucose, UA: NEGATIVE mg/dL
Ketones, ur: NEGATIVE mg/dL
Leukocytes, UA: NEGATIVE
Nitrite: NEGATIVE
PH: 5 (ref 5.0–8.0)
Protein, ur: NEGATIVE mg/dL
SPECIFIC GRAVITY, URINE: 1.005 (ref 1.005–1.030)

## 2017-10-14 MED ORDER — IBUPROFEN 400 MG PO TABS
400.0000 mg | ORAL_TABLET | Freq: Once | ORAL | Status: AC
  Administered 2017-10-14: 400 mg via ORAL
  Filled 2017-10-14: qty 1

## 2017-10-14 NOTE — ED Triage Notes (Signed)
Pt reports lower abd cramping that began around 0600 this am after having intercourse. Pt denies any bleeding or discharge.

## 2017-10-14 NOTE — ED Notes (Signed)
Patient transported to Ultrasound 

## 2017-10-14 NOTE — ED Provider Notes (Signed)
[facility] DEPARTMENT Provider Note   CSN: [id number] Arrival date & time: 10/14/17  4431     History   Chief Complaint Chief Complaint  Patient presents with  . Abdominal Pain    HPI Melissa Shelton is a 32 y.o. female.  Is a 32 year old female without significant medical history other than dysfunctional uterine bleeding presenting today with sudden onset of severe pelvic pain that started after intercourse this morning around 6 AM.  Patient states that she had no pain during intercourse it was not excessively rough or out of the ordinary but shortly after finishing this pain spontaneously started and she states it was the most severe pain she is ever had.  In the last 4 hours the pain has slightly improved and is now a 7 out of 10 but she is still having a lot of discomfort.  She denies any vaginal bleeding or symptoms prior to intercourse this morning such as vaginal discharge or bleeding.  Patient has a history of a tubal ligation and last menses was 3 weeks ago.  She has no prior history of ovarian cysts and denies any urinary symptoms.  He has had no fever, nausea, vomiting or diarrhea.  She has not taken anything for the pain.  The history is provided by the patient.  Abdominal Pain   This is a new problem. Episode onset: 4 hours ago. The problem occurs constantly. The problem has been gradually improving. The pain is associated with an unknown (started right after sexual intercourse this morning) factor. The pain is located in the suprapubic region. The quality of the pain is shooting, cramping and throbbing. The pain is at a severity of 10/10. The pain is severe. Pertinent negatives include anorexia, fever, diarrhea, melena, nausea, vomiting and frequency. Nothing aggravates the symptoms. Nothing relieves the symptoms.    Past Medical History:  Diagnosis Date  . ALLERGIC CONJUNCTIVITIS 06/28/2006  . CERVICAL DYSPLASIA 06/28/2006  . 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 MENSTRUATION, PAINFUL 06/28/2006  . RHINITIS, ALLERGIC 06/28/2006  . TOBACCO USER 05/03/2009    Patient Active Problem List   Diagnosis Date Noted  . Pain, dental 02/01/2017  . Influenza-like illness 07/08/2015  . Rash and nonspecific skin eruption 01/15/2014  . Vaginal discharge 12/04/2013  . Exposure to sexually transmitted disease (STD) 12/04/2013  . Dysuria 12/04/2013  . Injury, crush, foot 11/17/2013  . GERD (gastroesophageal reflux disease) 04/04/2013  . Poor dentition 10/23/2012  . Chronic venous insufficiency 09/25/2012  . 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 Yeast vaginitis 08/02/2012  . 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 TOBACCO USER 05/03/2009  . ALLERGIC CONJUNCTIVITIS 06/28/2006  . RHINITIS, ALLERGIC 06/28/2006  . Dysplasia of cervix (uteri) 06/28/2006    Past Surgical History:  Procedure Laterality Date  . APPENDECTOMY    . COLPOSCOPY     X2, biopsy only, no ablation  . TUBAL LIGATION Bilateral 02/21/2013   Procedure: POST PARTUM TUBAL LIGATION;  Surgeon: Woodroe Mode, MD;  Location: [facility];  Service: Gynecology;  Laterality: Bilateral;     OB History    Gravida  4   Para  3   Term  3   Preterm      AB  1   Living  3     SAB      TAB  1   Ectopic      Multiple      Live Births  3  Home Medications    Prior to Admission medications   Medication Sig Start Date End Date Taking? Authorizing Provider  clobetasol cream (TEMOVATE) 8.11 % Apply 1 application topically 2 (two) times daily. 04/11/17   Nicolette Bang, DO  naproxen (NAPROSYN) 375 MG tablet Take 1 tablet (375 mg total) by mouth 2 (two) times daily with a meal. 05/22/17   Waynetta Pean, PA-C  penicillin v potassium (VEETID) 500 MG tablet Take 1 tablet (500 mg total) by mouth 4 (four) times daily. 05/22/17   Waynetta Pean, PA-C  polyethylene glycol powder (GLYCOLAX/MIRALAX) powder Take 17 g by mouth  daily. 05/15/13   Gerda Diss, DO  Prenatal Vit-Fe Fumarate-FA (PRENATAL MULTIVITAMIN) TABS Take 1 tablet by mouth daily at 12 noon.    [provider]    Family History Family History  Problem Relation Age of Onset  . Cervical cancer Unknown        Prevelant accross maternal side of family  . High blood pressure Unknown        on maternal side  . Diabetes Unknown   . Autoimmune disease Unknown     Social History Social History   Tobacco Use  . Smoking status: Current Every Day Smoker    Packs/day: 0.50    Years: 8.00    Pack years: 4.00    Types: Cigarettes    Last attempt to quit: 07/10/2012    Years since quitting: 5.2  . Smokeless tobacco: Never Used  . Tobacco comment: Quit cold [place] when found out she was pregnant  Substance Use Topics  . Alcohol use: No  . Drug use: No     Allergies   Patient has no known allergies.   Review of Systems Review of Systems  Constitutional: Negative for fever.  Gastrointestinal: Positive for abdominal pain. Negative for anorexia, diarrhea, melena, nausea and vomiting.  Genitourinary: Negative for frequency.  All other systems reviewed and are negative.    Physical Exam Updated Vital Signs BP (!) 110/56 (BP Location: Right Arm)   Pulse 78   Temp 98.2 F (36.8 C) (Oral)   Resp 20   Ht 5' 5.5" (1.664 m)   Wt 65.8 kg (145 lb)   LMP 09/19/2017 (Approximate)   SpO2 98%   BMI 23.76 kg/m   Physical Exam  Constitutional: She is oriented to person, place, and time. She appears well-developed and well-nourished. No distress.  HENT:  Head: Normocephalic and atraumatic.  Mouth/Throat: Oropharynx is clear and moist.  Eyes: Pupils are equal, round, and reactive to light. Conjunctivae and EOM are normal.  Neck: Normal range of motion. Neck supple.  Cardiovascular: Normal rate, regular rhythm and intact distal pulses.  No murmur heard. Pulmonary/Chest: Effort normal and breath sounds normal. No respiratory  distress. She has no wheezes. She has no rales.  Abdominal: Soft. She exhibits no distension. There is tenderness in the suprapubic area. There is no rebound and no guarding.  Genitourinary: Uterus is tender. Cervix exhibits no motion tenderness, no discharge and no friability. Right adnexum displays no mass, no tenderness and no fullness. Left adnexum displays no mass, no tenderness and no fullness. No tenderness in the vagina. No vaginal discharge found.  Musculoskeletal: Normal range of motion. She exhibits no edema or tenderness.  Neurological: She is alert and oriented to person, place, and time.  Skin: Skin is warm and dry. No rash noted. No erythema.  Psychiatric: She has a normal mood and affect. Her behavior is normal.  Nursing  note and vitals reviewed.    ED Treatments / Results  Labs (all labs ordered are listed, but only abnormal results are displayed) Labs Reviewed  COMPREHENSIVE METABOLIC PANEL - Abnormal; Notable for the following components:      Result Value   Glucose, Bld 101 (*)    ALT 13 (*)    All other components within normal limits  URINALYSIS, ROUTINE W REFLEX MICROSCOPIC - Abnormal; Notable for the following components:   Color, Urine STRAW (*)    Hgb urine dipstick SMALL (*)    Bacteria, UA RARE (*)    All other components within normal limits  CBC  I-STAT BETA HCG BLOOD, ED ([facility], [facility], [facility] ONLY)    EKG None  Radiology US Transvaginal Non-ob  Result Date: 10/14/2017 CLINICAL DATA:  Pelvic pain EXAM: TRANSABDOMINAL AND TRANSVAGINAL ULTRASOUND OF PELVIS DOPPLER ULTRASOUND OF OVARIES TECHNIQUE: Both transabdominal and transvaginal ultrasound examinations of the pelvis were performed. Transabdominal technique was performed for global imaging of the pelvis including uterus, ovaries, adnexal regions, and pelvic cul-de-sac. It was necessary to proceed with endovaginal exam following the transabdominal exam to visualize the ovaries. Color and duplex Doppler  ultrasound was utilized to evaluate blood flow to the ovaries. COMPARISON:  None. FINDINGS: Uterus Measurements: 9.5 x 5.1 x 6.4 cm. No fibroids or other mass visualized. Endometrium Thickness: 13 mm in thickness.  No focal abnormality visualized. Right ovary Measurements: 3.5 x 2.2 x 2.3 cm. Normal appearance/no adnexal mass. Left ovary Measurements: 3.8 x 3.2 x 3.1 cm. Complex soft tissue area noted in the left adnexa separate from the left ovary measuring 7.1 x 4.7 x 3.1 cm. No visible internal blood flow. Pulsed Doppler evaluation of both ovaries demonstrates normal low-resistance arterial and venous waveforms. Other findings Moderate free fluid. IMPRESSION: Soft tissue area in the left adnexa separate from the left ovary of unknown etiology. No visible internal blood flow. This could reflect decompressed small bowel loops, but cannot exclude other cause of adnexal mass. This could be further evaluated with CT with oral and IV contrast if felt clinically indicated. Moderate free fluid. Electronically Signed   By: Rolm Baptise M.D.   On: 10/14/2017 11:40   US Pelvis Complete  Result Date: 10/14/2017 CLINICAL DATA:  Pelvic pain EXAM: TRANSABDOMINAL AND TRANSVAGINAL ULTRASOUND OF PELVIS DOPPLER ULTRASOUND OF OVARIES TECHNIQUE: Both transabdominal and transvaginal ultrasound examinations of the pelvis were performed. Transabdominal technique was performed for global imaging of the pelvis including uterus, ovaries, adnexal regions, and pelvic cul-de-sac. It was necessary to proceed with endovaginal exam following the transabdominal exam to visualize the ovaries. Color and duplex Doppler ultrasound was utilized to evaluate blood flow to the ovaries. COMPARISON:  None. FINDINGS: Uterus Measurements: 9.5 x 5.1 x 6.4 cm. No fibroids or other mass visualized. Endometrium Thickness: 13 mm in thickness.  No focal abnormality visualized. Right ovary Measurements: 3.5 x 2.2 x 2.3 cm. Normal appearance/no adnexal mass.  Left ovary Measurements: 3.8 x 3.2 x 3.1 cm. Complex soft tissue area noted in the left adnexa separate from the left ovary measuring 7.1 x 4.7 x 3.1 cm. No visible internal blood flow. Pulsed Doppler evaluation of both ovaries demonstrates normal low-resistance arterial and venous waveforms. Other findings Moderate free fluid. IMPRESSION: Soft tissue area in the left adnexa separate from the left ovary of unknown etiology. No visible internal blood flow. This could reflect decompressed small bowel loops, but cannot exclude other cause of adnexal mass. This could be further evaluated with CT with  oral and IV contrast if felt clinically indicated. Moderate free fluid. Electronically Signed   By: Rolm Baptise M.D.   On: 10/14/2017 11:40   [place] Art/ven Flow Abd Pelv Doppler  Result Date: 10/14/2017 CLINICAL DATA:  Pelvic pain EXAM: TRANSABDOMINAL AND TRANSVAGINAL ULTRASOUND OF PELVIS DOPPLER ULTRASOUND OF OVARIES TECHNIQUE: Both transabdominal and transvaginal ultrasound examinations of the pelvis were performed. Transabdominal technique was performed for global imaging of the pelvis including uterus, ovaries, adnexal regions, and pelvic cul-de-sac. It was necessary to proceed with endovaginal exam following the transabdominal exam to visualize the ovaries. Color and duplex Doppler ultrasound was utilized to evaluate blood flow to the ovaries. COMPARISON:  None. FINDINGS: Uterus Measurements: 9.5 x 5.1 x 6.4 cm. No fibroids or other mass visualized. Endometrium Thickness: 13 mm in thickness.  No focal abnormality visualized. Right ovary Measurements: 3.5 x 2.2 x 2.3 cm. Normal appearance/no adnexal mass. Left ovary Measurements: 3.8 x 3.2 x 3.1 cm. Complex soft tissue area noted in the left adnexa separate from the left ovary measuring 7.1 x 4.7 x 3.1 cm. No visible internal blood flow. Pulsed Doppler evaluation of both ovaries demonstrates normal low-resistance arterial and venous waveforms. Other findings Moderate  free fluid. IMPRESSION: Soft tissue area in the left adnexa separate from the left ovary of unknown etiology. No visible internal blood flow. This could reflect decompressed small bowel loops, but cannot exclude other cause of adnexal mass. This could be further evaluated with CT with oral and IV contrast if felt clinically indicated. Moderate free fluid. Electronically Signed   By: Rolm Baptise M.D.   On: 10/14/2017 11:40    Procedures Procedures (including critical care time)  Medications Ordered in ED Medications  ibuprofen (ADVIL,MOTRIN) tablet 400 mg (400 mg Oral Given 10/14/17 1016)     Initial Impression / Assessment and Plan / ED Course  I have reviewed the triage vital signs and the nursing notes.  Pertinent labs & imaging results that were available during my care of the patient were reviewed by me and considered in my medical decision making (see chart for details).     Patient presenting with sudden onset of abdominal pain after intercourse this morning concerning for possible ovarian torsion versus ruptured cyst.  On exam patient has no evidence of trauma or vaginal bleeding.  She does not have excessive tenderness over the adnexa and has some mild uterine discomfort with palpation. Patient has had a prior tubal ligation and low suspicion for ectopic pregnancy at this time.  Also hCG is negative.  Patient's UA without acute findings.  Ultrasound pending to evaluate for above.  Patient given ibuprofen for pain.  12:27 PM Patient's CMP without acute findings, CBC within normal limits, ultrasound shows soft tissue area in the left adnexa separate from the left ovary of unknown etiology no visible internal blood flow present this could reflect decompressed small bowel but could not exclude other causes of an adnexal mass.  They recommended further follow-up with a CT of the pelvis with oral and IV contrast if indicated.  Patient does have moderate free fluid.  Patient is feeling much  better on reevaluation.  She is no longer having significant pelvic tenderness.  Patient has noted good flow to both ovaries without evidence of torsion.  Discussed with her the option of having a CT now or following up with her regular doctor.  Patient with shared decision making has decided to follow-up with her doctor for further work-up as she states she is  hungry and really would like to go home.  Gave patient strict return precautions but will otherwise discharge home.  Final Clinical Impressions(s) / ED Diagnoses   Final diagnoses:  Pelvic pain in female    ED Discharge Orders    None       Blanchie Dessert, MD 10/14/17 1229

## 2019-08-11 IMAGING — US US ART/VEN ABD/PELV/SCROTUM DOPPLER LTD
1 series · 13 of 25 positions shown · non-contrast
Comparison: None.

CLINICAL DATA: Pelvic pain

EXAM:
TRANSABDOMINAL AND TRANSVAGINAL ULTRASOUND OF PELVIS
DOPPLER ULTRASOUND OF OVARIES
TECHNIQUE: Both transabdominal and transvaginal ultrasound examinations of the
pelvis were performed. Transabdominal technique was performed for
global imaging of the pelvis including uterus, ovaries, adnexal
regions, and pelvic cul-de-sac.
It was necessary to proceed with endovaginal exam following the
transabdominal exam to visualize the ovaries. Color and duplex
Doppler ultrasound was utilized to evaluate blood flow to the
ovaries.

[Series 1: us art/ven abd/pelv/scrotum doppler ltd · 0.22mm/px · 100 acquisitions, 13 frames shown]
[im 1/100]
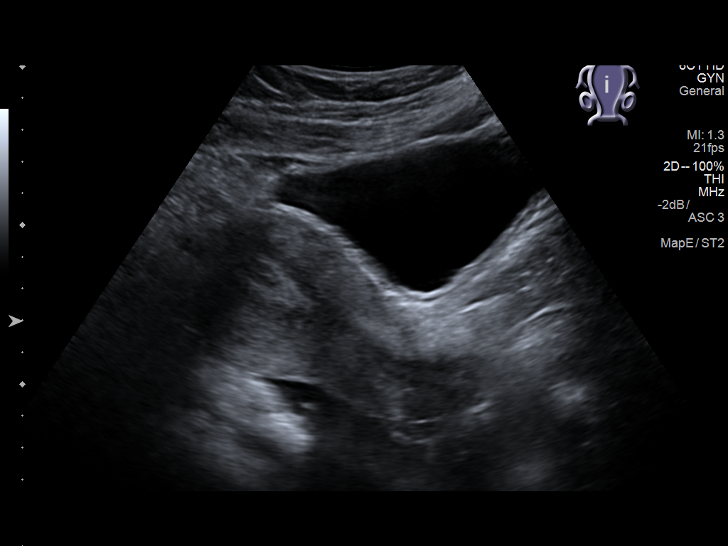
[im 9/100]
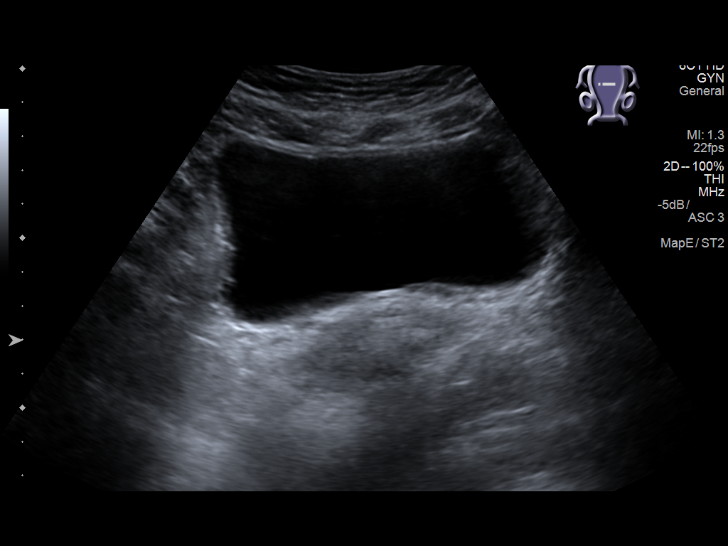
[im 17/100]
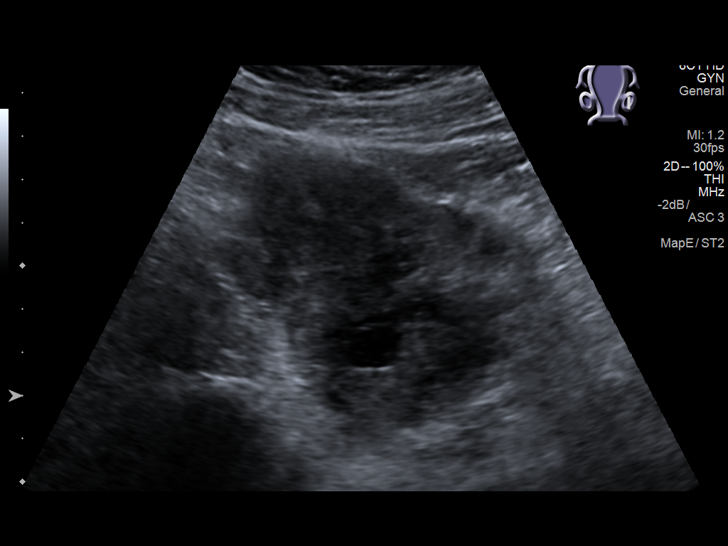
[im 25/100]
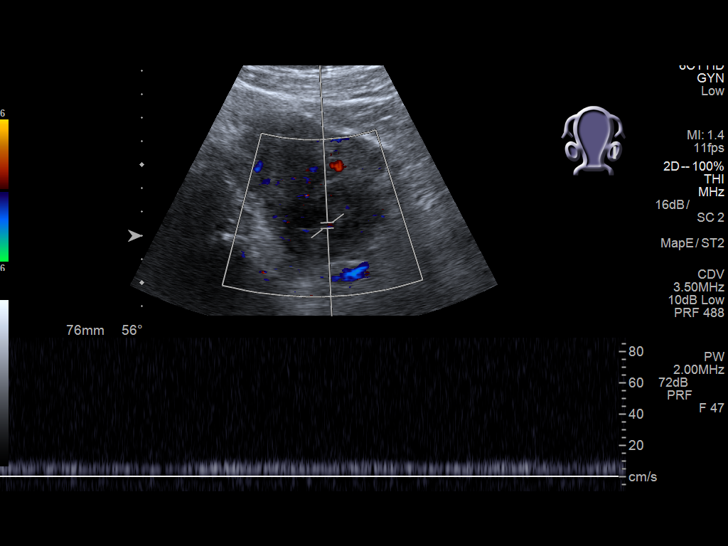
[im 34/100]
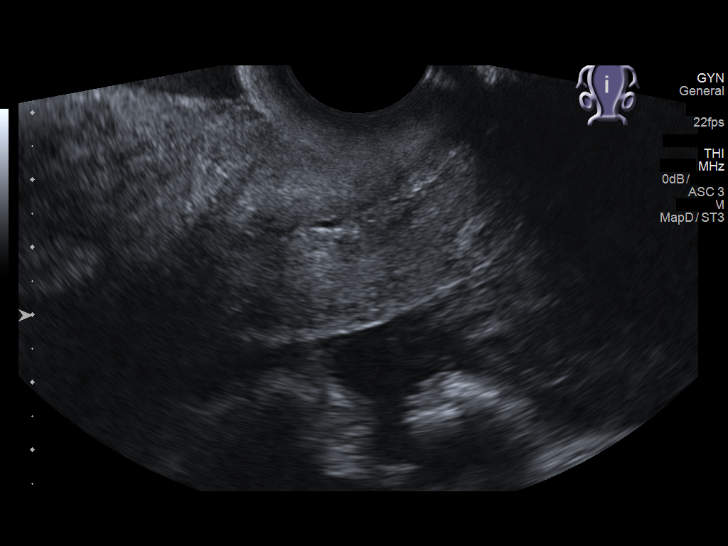
[im 42/100]
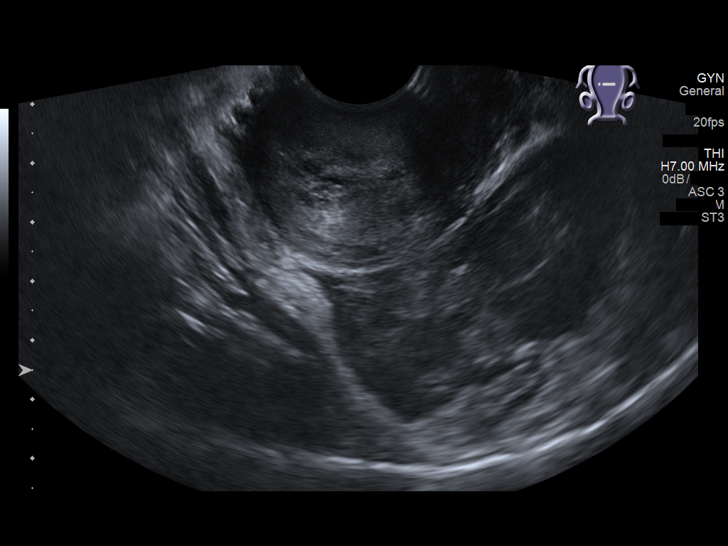
[im 50/100]
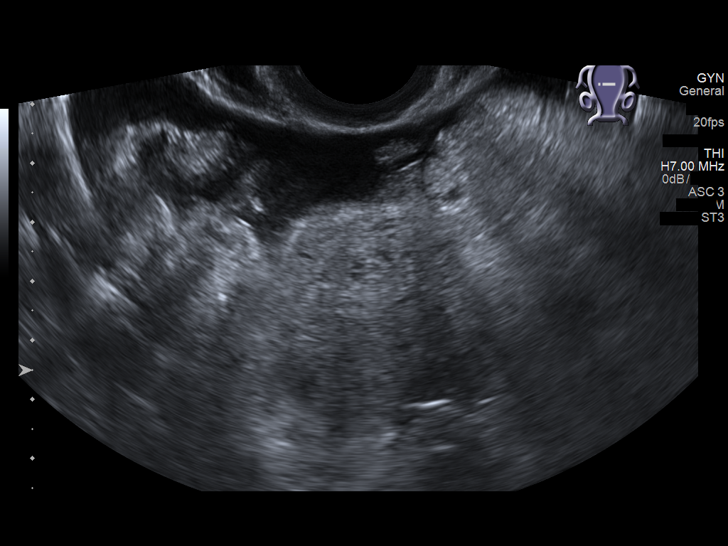
[im 58/100]
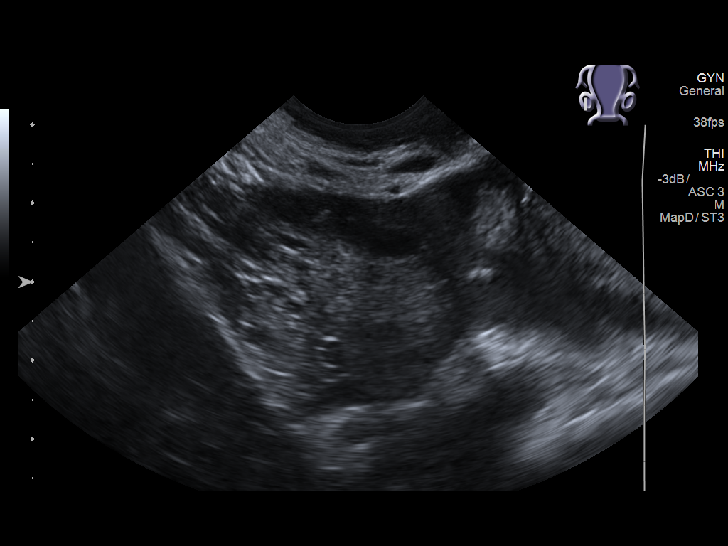
[im 67/100]
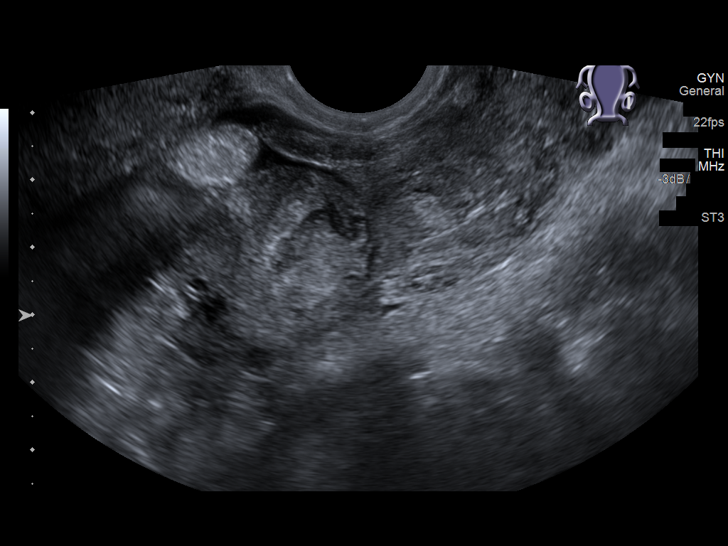
[im 75/100]
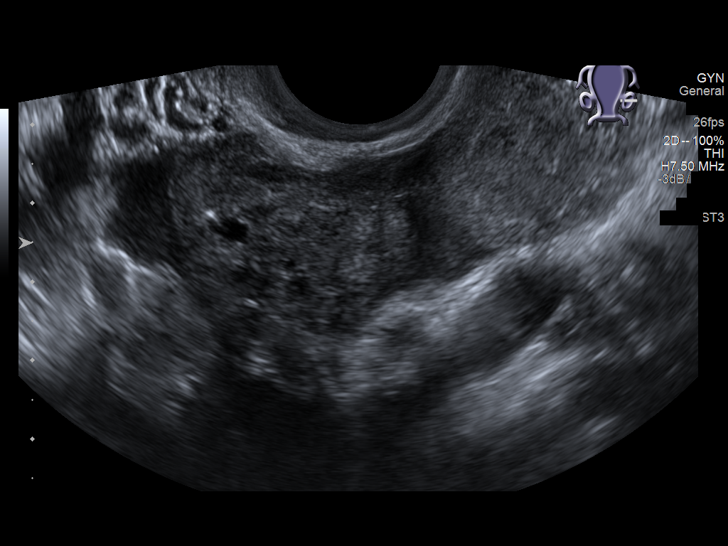
[im 83/100]
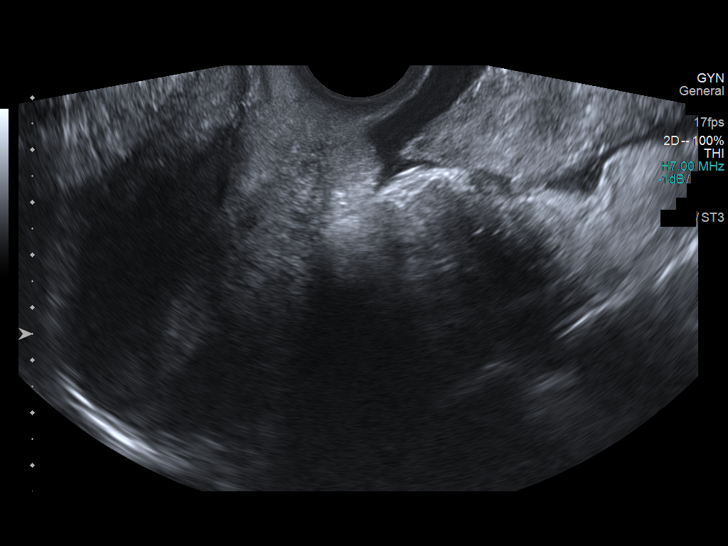
[im 91/100]
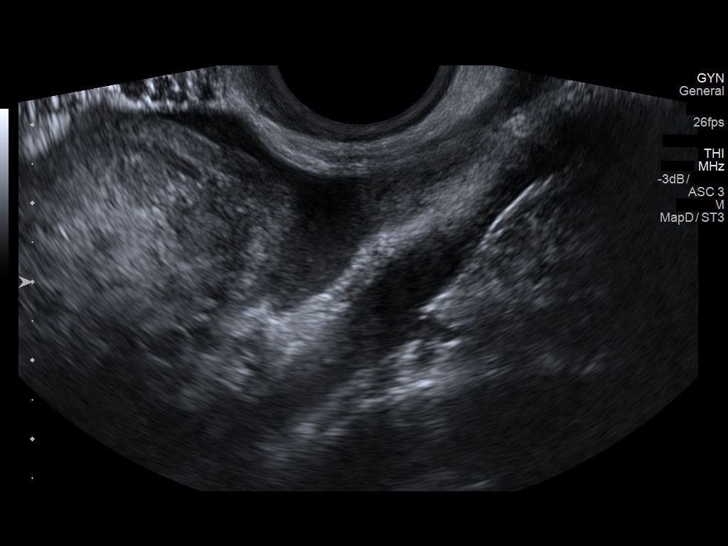
[im 100/100]
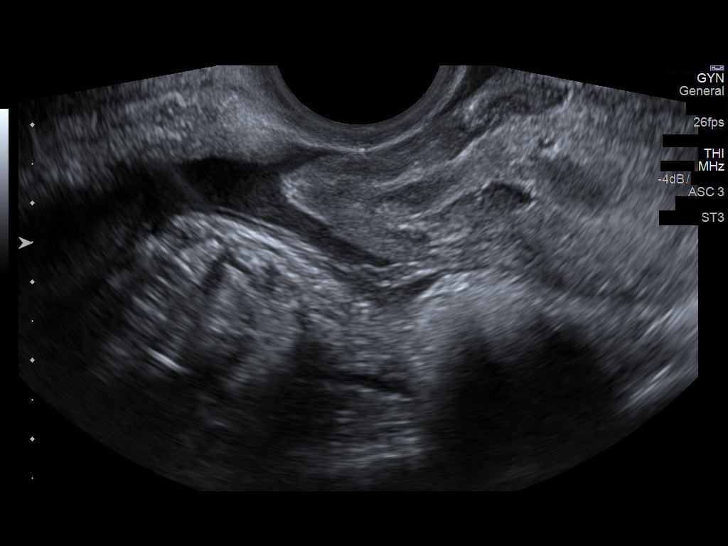

[13 of 25 positions shown; findings below may reference images not displayed]

FINDINGS: Uterus

Measurements: 9.5 x 5.1 x 6.4 cm. No fibroids or other mass
visualized.

Endometrium

Thickness: 13 mm in thickness.  No focal abnormality visualized.

Right ovary

Measurements: 3.5 x 2.2 x 2.3 cm. Normal appearance/no adnexal mass.

Left ovary

Measurements: 3.8 x 3.2 x 3.1 cm. Complex soft tissue area noted in
the left adnexa separate from the left ovary measuring 7.1 x 4.7 x
3.1 cm. No visible internal blood flow..

Pulsed Doppler evaluation of both ovaries demonstrates normal
low-resistance arterial and venous waveforms.

Other findings

Moderate free fluid.
IMPRESSION: Soft tissue area in the left adnexa separate from the left ovary of
unknown etiology. No visible internal blood flow. This could reflect
decompressed small bowel loops, but cannot exclude other cause of
adnexal mass. This could be further evaluated with CT with oral and
IV contrast if felt clinically indicated.

Moderate free fluid.
